# Patient Record
Sex: Female | Born: 2000
Health system: Southern US, Community
[De-identification: ages and names within clinical notes are randomized; demographics above are authoritative.]

## PROBLEM LIST (undated history)

## (undated) DIAGNOSIS — H539 Unspecified visual disturbance: Secondary | ICD-10-CM

## (undated) DIAGNOSIS — F39 Unspecified mood [affective] disorder: Secondary | ICD-10-CM

## (undated) DIAGNOSIS — F429 Obsessive-compulsive disorder, unspecified: Secondary | ICD-10-CM

## (undated) DIAGNOSIS — F419 Anxiety disorder, unspecified: Secondary | ICD-10-CM

## (undated) HISTORY — DX: Anxiety disorder, unspecified: F41.9

## (undated) HISTORY — DX: Unspecified mood (affective) disorder: F39

## (undated) HISTORY — DX: Unspecified visual disturbance: H53.9

## (undated) HISTORY — DX: Obsessive-compulsive disorder, unspecified: F42.9

---
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
## 2020-01-01 ENCOUNTER — Other Ambulatory Visit (HOSPITAL_COMMUNITY): Payer: 59 | Attending: Psychiatry | Admitting: Licensed Clinical Social Worker

## 2020-01-01 ENCOUNTER — Other Ambulatory Visit: Payer: Self-pay

## 2020-01-01 DIAGNOSIS — R45851 Suicidal ideations: Secondary | ICD-10-CM | POA: Insufficient documentation

## 2020-01-01 DIAGNOSIS — Z915 Personal history of self-harm: Secondary | ICD-10-CM | POA: Insufficient documentation

## 2020-01-01 DIAGNOSIS — F329 Major depressive disorder, single episode, unspecified: Secondary | ICD-10-CM | POA: Insufficient documentation

## 2020-01-01 DIAGNOSIS — F419 Anxiety disorder, unspecified: Secondary | ICD-10-CM | POA: Insufficient documentation

## 2020-01-01 DIAGNOSIS — R4589 Other symptoms and signs involving emotional state: Secondary | ICD-10-CM | POA: Diagnosis not present

## 2020-01-01 DIAGNOSIS — R441 Visual hallucinations: Secondary | ICD-10-CM | POA: Insufficient documentation

## 2020-01-01 DIAGNOSIS — R440 Auditory hallucinations: Secondary | ICD-10-CM | POA: Diagnosis not present

## 2020-01-01 DIAGNOSIS — Z79899 Other long term (current) drug therapy: Secondary | ICD-10-CM | POA: Insufficient documentation

## 2020-01-01 DIAGNOSIS — Z658 Other specified problems related to psychosocial circumstances: Secondary | ICD-10-CM | POA: Insufficient documentation

## 2020-01-01 DIAGNOSIS — F429 Obsessive-compulsive disorder, unspecified: Secondary | ICD-10-CM | POA: Diagnosis not present

## 2020-01-01 DIAGNOSIS — F333 Major depressive disorder, recurrent, severe with psychotic symptoms: Secondary | ICD-10-CM | POA: Insufficient documentation

## 2020-01-01 DIAGNOSIS — F411 Generalized anxiety disorder: Secondary | ICD-10-CM | POA: Insufficient documentation

## 2020-01-05 ENCOUNTER — Encounter (HOSPITAL_COMMUNITY): Payer: Self-pay | Admitting: Specialist

## 2020-01-05 ENCOUNTER — Other Ambulatory Visit (HOSPITAL_COMMUNITY): Payer: 59 | Admitting: Licensed Clinical Social Worker

## 2020-01-05 ENCOUNTER — Other Ambulatory Visit: Payer: Self-pay

## 2020-01-05 ENCOUNTER — Other Ambulatory Visit (HOSPITAL_COMMUNITY): Payer: 59 | Admitting: Specialist

## 2020-01-05 DIAGNOSIS — Z915 Personal history of self-harm: Secondary | ICD-10-CM | POA: Diagnosis not present

## 2020-01-05 DIAGNOSIS — F411 Generalized anxiety disorder: Secondary | ICD-10-CM

## 2020-01-05 DIAGNOSIS — F329 Major depressive disorder, single episode, unspecified: Secondary | ICD-10-CM | POA: Diagnosis not present

## 2020-01-05 DIAGNOSIS — Z658 Other specified problems related to psychosocial circumstances: Secondary | ICD-10-CM | POA: Diagnosis not present

## 2020-01-05 DIAGNOSIS — R4589 Other symptoms and signs involving emotional state: Secondary | ICD-10-CM | POA: Diagnosis not present

## 2020-01-05 DIAGNOSIS — R441 Visual hallucinations: Secondary | ICD-10-CM | POA: Diagnosis not present

## 2020-01-05 DIAGNOSIS — F419 Anxiety disorder, unspecified: Secondary | ICD-10-CM | POA: Diagnosis not present

## 2020-01-05 DIAGNOSIS — F429 Obsessive-compulsive disorder, unspecified: Secondary | ICD-10-CM | POA: Diagnosis not present

## 2020-01-05 DIAGNOSIS — R440 Auditory hallucinations: Secondary | ICD-10-CM | POA: Diagnosis not present

## 2020-01-05 DIAGNOSIS — F333 Major depressive disorder, recurrent, severe with psychotic symptoms: Secondary | ICD-10-CM

## 2020-01-05 DIAGNOSIS — R45851 Suicidal ideations: Secondary | ICD-10-CM | POA: Diagnosis not present

## 2020-01-05 NOTE — Psych (Signed)
Virtual Visit via Video Note  I connected with Susan [name] on 01/01/20 at 10:00 AM EST by a video enabled telemedicine application and verified that I am speaking with the correct person using two identifiers.   I discussed the limitations of evaluation and management by telemedicine and the availability of in person appointments. The patient expressed understanding and agreed to proceed.  Follow Up Instructions:    I discussed the assessment and treatment plan with the patient. The patient was provided an opportunity to ask questions and all were answered. The patient agreed with the plan and demonstrated an understanding of the instructions.   The patient was advised to call back or seek an in-person evaluation if the symptoms worsen or if the condition fails to improve as anticipated.  I provided 60 minutes of non-face-to-face time during this encounter.   Quinn Axe, [facility]    Comprehensive Clinical Assessment (CCA) Note  01/01/2020 Susan [name] [id number]  Visit Diagnosis:      ICD-10-CM   1. Severe recurrent major depressive disorder with psychotic features (HCC)  F33.3   2. Generalized anxiety disorder  F41.1       CCA Part One  Part One has been completed on paper by the patient.  (See scanned document in Chart Review)  CCA Part Two A  Intake/Chief Complaint:  CCA Intake With Chief Complaint CCA Part Two Date: 01/01/20 CCA Part Two Time: 1000 Chief Complaint/Presenting Problem: Pt reports to [facility] per [facility]; pt was inpt for 1 week due to SI. Pt reports 1 attempt in 8th grade by cutting wrists but stopped self, no medical attention needed. Pt denies other hospitalizations. Pt reports seeing Gillie Manners for a few years- discharged in 2019 because pt was reporting she was "fine" due to fears therapist would share information from appointment with mother due to being a minor. Pt does not have a current therapist. Currently sees Dr. Len Blalock for psychiatry. Pt reports severe decrease in ADLs, including not showering for a few weeks. Pt reports stressors include school, college applications, and thoughts about the future. Pt reports [facility] gave her diagnosis of Borderline Personality Disorder. Pt shares she has passive SI daily, without intent/plan. AVH: Pt shares she has been seeing and hearing her dog around the house since the dog's death in 06/15/19. Pt shares she sees someone's "energy behind them- like a fog" for a few weeks now. Pt reports self-harm by cutting in past; most recently a month ago; denies current thoughts. Pt shares she has burned self freshman year; picks at nails until they bleed; and will slap/punch face (daily occurrence). Family history: Paternal Grandfather: bipolar and OCD; Paternal Grandmother: anxiety and OCD; Maternal Grandmother: depression. Pt reports increased isolation due to depression and COVID. Pt denies current SI with plan/intent or HI. Patients Currently Reported Symptoms/Problems: SI without plan/intent; recent self-harm acts; increased depression and anxiety; severely decreased ADLs; lacks motivation; no energy; "feels heavy, my body and my brain," can't get out of bed; lacks concentration; reports short term memory issues; feelings of hopelessness/worthlessness; no appetite currently (pt reports 30lb weight gain since March 2020 but does not have appetite currently), sleep issues (getting better with increased Seroquel); hallucinations; increased irritability; impulsiveness Collateral Involvement: Patient's mother sat in on first part of CCA per patient request Individual's Strengths: motivation for treatment; understand treatment options; Individual's Preferences: to feel better Individual's Abilities: can attend and participate in group Type of Services Patient Feels Are Needed: PHP Initial  Clinical Notes/Concerns: Pt was discharged from inpt due to SI; pt was inpt for 1 week.  Mental  Health Symptoms Depression:  Depression: Change in energy/activity, Difficulty Concentrating, Fatigue, Hopelessness, Increase/decrease in appetite, Irritability, Weight gain/loss, Worthlessness, Tearfulness  Mania:     Anxiety:   Anxiety: Difficulty concentrating, Fatigue, Irritability, Restlessness, Sleep, Worrying, Tension  Psychosis:     Trauma:     Obsessions:     Compulsions:     Inattention:     Hyperactivity/Impulsivity:     Oppositional/Defiant Behaviors:     Borderline Personality:  Emotional Irregularity: Chronic feelings of emptiness, Mood lability, Potentially harmful impulsivity  Other Mood/Personality Symptoms:      Mental Status Exam Appearance and self-care  Stature:  Stature: Average  Weight:  Weight: Average weight  Clothing:  Clothing: Casual  Grooming:  Grooming: Normal  Cosmetic use:  Cosmetic Use: None  Posture/gait:  Posture/Gait: Normal  Motor activity:  Motor Activity: Not Remarkable  Sensorium  Attention:  Attention: Normal  Concentration:  Concentration: Normal  Orientation:     Recall/memory:  Recall/Memory: Normal  Affect and Mood  Affect:  Affect: Depressed, Tearful  Mood:  Mood: Depressed  Relating  Eye contact:  Eye Contact: Fleeting  Facial expression:  Facial Expression: Depressed  Attitude toward examiner:  Attitude Toward Examiner: Cooperative  Thought and Language  Speech flow: Speech Flow: Normal  Thought content:  Thought Content: Appropriate to mood and circumstances  Preoccupation:     Hallucinations:  Hallucinations: Auditory, Visual  Organization:     Company secretary of Knowledge:  Fund of Knowledge: Average  Intelligence:  Intelligence: Average  Abstraction:  Abstraction: Normal  Judgement:  Judgement: Poor  Reality Testing:  Reality Testing: Adequate  Insight:  Insight: Fair  Decision Making:  Decision Making: Impulsive  Social Functioning  Social Maturity:  Social Maturity: Impulsive, Isolates  Social  Judgement:  Social Judgement: Normal  Stress  Stressors:  Stressors: Family conflict, Transitions, Illness  Coping Ability:  Coping Ability: Deficient supports  Skill Deficits:     Supports:      Family and Psychosocial History: Family history Marital status: Single Are you sexually active?: No What is your sexual orientation?: queer Does patient have children?: No  Childhood History:  Childhood History By whom was/is the patient raised?: Both parents Additional childhood history information: Pt reports father works in [place] and returns a few days a month. Pt shares the house is "disrupted" when father returns because "he's a know it all and thinks we're not living right but it works for [place]." Description of patient's relationship with caregiver when they were a child: good with mom; distant with dad Patient's description of current relationship with people who raised him/her: same How were you disciplined when you got in trouble as a child/adolescent?: grounding Does patient have siblings?: Yes Number of Siblings: 1 Description of patient's current relationship with siblings: older brother; ok Did patient suffer any verbal/emotional/physical/sexual abuse as a child?: No Did patient suffer from severe childhood neglect?: No Has patient ever been sexually abused/assaulted/raped as an adolescent or adult?: No Was the patient ever a victim of a crime or a disaster?: No Witnessed domestic violence?: No Has patient been effected by domestic violence as an adult?: No  CCA Part Two B  Employment/Work Situation: Employment / Work Psychologist, occupational Employment situation: Surveyor, minerals job has been impacted by current illness: Yes Describe how patient's job has been impacted: pt reports decreased motivation to complete school assignments; inability to  focus Did You Receive Any Psychiatric Treatment/Services While in the Military?: No Are There Guns or Other Weapons in Your Home?: Yes Types of  Guns/Weapons: 3 side arms Are These Weapons Safely Secured?: Yes(mother verifies)  Education: Education Did Garment/textile technologist From [employer]?: (current high school student) Did You Have An Individualized Education Program (IIEP): No Did You Have Any Difficulty At [employer]?: Yes Were Any Medications Ever Prescribed For These Difficulties?: Yes Medications Prescribed For School Difficulties?: depression, anxiety  Religion: Religion/Spirituality Are You A Religious Person?: No  Leisure/Recreation: Leisure / Recreation Leisure and Hobbies: hanging out with friends  Exercise/Diet: Exercise/Diet Do You Exercise?: No Have You Gained or Lost A Significant Amount of Weight in the Past Six Months?: Yes-Gained Number of Pounds Gained: 30(1 year) Do You Follow a Special Diet?: No Do You Have Any Trouble Sleeping?: Yes Explanation of Sleeping Difficulties: Seroquel helps; trouble falling and staying asleep  CCA Part Two C  Alcohol/Drug Use: Alcohol / Drug Use Pain Medications: SEE MAR Prescriptions: SEE MAR Over the Counter: SEE MAR History of alcohol / drug use?: Yes Substance #1 Name of Substance 1: Marijuana 1 - Age of First Use: teens 1 - Amount (size/oz): varies; social user 1 - Frequency: 1x every other month 1 - Duration: on-going 1 - Last Use / Amount: 1 month ago Substance #2 Name of Substance 2: CBD 2 - Age of First Use: teens 2 - Amount (size/oz): varies 2 - Frequency: daily 2 - Duration: not current 2 - Last Use / Amount: 12/18/2019    CCA Part Three  ASAM's:  Six Dimensions of Multidimensional Assessment  Dimension 1:  Acute Intoxication and/or Withdrawal Potential:     Dimension 2:  Biomedical Conditions and Complications:     Dimension 3:  Emotional, Behavioral, or Cognitive Conditions and Complications:     Dimension 4:  Readiness to Change:     Dimension 5:  Relapse, Continued use, or Continued Problem Potential:     Dimension 6:  Recovery/Living  Environment:      Substance use Disorder (SUD)    Social Function:  Social Functioning Social Maturity: Impulsive, Isolates Social Judgement: Normal  Stress:  Stress Stressors: Family conflict, Transitions, Illness Coping Ability: Deficient supports Priority Risk: Moderate Risk  Risk Assessment- Self-Harm Potential: Risk Assessment For Self-Harm Potential Thoughts of Self-Harm: Vague current thoughts Method: No plan Availability of Means: No access/NA Additional Information for Self-Harm Potential: Acts of Self-harm, Previous Attempts Additional Comments for Self-Harm Potential: Pt reports 1 previous attempt in 8th grade by cutting- able to stop self; pt reports acts of self-harm (cutting, burning, picking nails, punching/slapping face): punching/slapping face and nail picking until they bleed daily; cutting- most recent 1 month ago  Risk Assessment -Dangerous to Others Potential: Risk Assessment For Dangerous to Others Potential Method: No Plan  DSM5 Diagnoses: Patient Active Problem List   Diagnosis Date Noted  . Severe recurrent major depressive disorder with psychotic features (HCC) 01/01/2020  . Generalized anxiety disorder 01/01/2020  . 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    Patient Centered Plan: Patient is on the following Treatment Plan(s):  Depression  Recommendations for Services/Supports/Treatments: Recommendations for Services/Supports/Treatments Recommendations For Services/Supports/Treatments: Partial Hospitalization(Pt just discharged from inpt due to SI; pt has severe decrease in ADLs; pt has passive SI and hallucinations)  Treatment Plan Summary:  Pt states, "I want to function."  Referrals to Alternative Service(s): Referred to Alternative Service(s):  Place:   Date:   Time:    Referred to Alternative Service(s):   Place:   Date:   Time:    Referred to  Alternative Service(s):   Place:   Date:   Time:    Referred to Alternative Service(s):   Place:   Date:   Time:     Royetta Crochet

## 2020-01-05 NOTE — Psych (Signed)
Virtual Visit via Video Note  I connected with Susan [name] on 01/05/20 at  9:00 AM EDT by a video enabled telemedicine application and verified that I am speaking with the correct person using two identifiers.   I discussed the limitations of evaluation and management by telemedicine and the availability of in person appointments. The patient expressed understanding and agreed to proceed.  Follow Up Instructions:    I discussed the assessment and treatment plan with the patient. The patient was provided an opportunity to ask questions and all were answered. The patient agreed with the plan and demonstrated an understanding of the instructions.   The patient was advised to call back or seek an in-person evaluation if the symptoms worsen or if the condition fails to improve as anticipated.  I provided 10 minutes of non-face-to-face time during this encounter.  Patient verbally agrees to treatment plan.  Quinn Axe, [facility]

## 2020-01-05 NOTE — Psych (Deleted)
Virtual Visit via Video Note  I connected with Susan [name] on 01/05/20 at  by a video enabled telemedicine application and verified that I am speaking with the correct person using two identifiers.   I discussed the limitations of evaluation and management by telemedicine and the availability of in person appointments. The patient expressed understanding and agreed to proceed.  Follow Up Instructions:    I discussed the assessment and treatment plan with the patient. The patient was provided an opportunity to ask questions and all were answered. The patient agreed with the plan and demonstrated an understanding of the instructions.   The patient was advised to call back or seek an in-person evaluation if the symptoms worsen or if the condition fails to improve as anticipated.  I provided *** minutes of non-face-to-face time during this encounter.   Quinn Axe, [facility]

## 2020-01-05 NOTE — Therapy (Signed)
[facility] PARTIAL HOSPITALIZATION PROGRAM [address] [place], [place], [address] Phone: [phone]   Fax:  [phone]  Occupational Therapy Evaluation  Patient Details  Name: Susan [name] MRN: [id number] Date of Birth: 05/18/2001 Referring Provider (OT): Hillery Jacks   Encounter Date: 01/05/2020  OT End of Session - 01/05/20 1320    Visit Number  1    Number of Visits  8    Date for OT Re-Evaluation  02/02/20    OT Start Time  1030    OT Stop Time  1100    OT Time Calculation (min)  30 min    Activity Tolerance  Patient tolerated treatment well    Behavior During Therapy  [facility] for tasks assessed/performed       Past Medical History:  Diagnosis Date  . Anxiety   . Mood disorder (HCC)   . OCD (obsessive compulsive disorder)   . Vision abnormalities     History reviewed. No pertinent surgical history.  There were no vitals filed for this visit.     [facility] OT Assessment - 01/05/20 0001      Assessment   Medical Diagnosis  Difficulty Coping, GAD, MDD    Referring Provider (OT)  Hillery Jacks      Precautions   Precautions  None      Restrictions   Weight Bearing Restrictions  No      Balance Screen   Has the patient fallen in the past 6 months  No    Has the patient had a decrease in activity level because of a fear of falling?   No    Is the patient reluctant to leave their home because of a fear of falling?   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 [employer] enjoys watching SNL, wants to be a Production manager support parent, small circle of friends  Struggles: healthy coping strategies OT goal:  Learn healthy coping  strategies  OCAIRS Mental Health Interview Summary of Client Scores:  FACILITATES PARTICIPATION IN OCCUPATION  ALLOWS PARTICIPATION IN OCCUPATION INHIBITS PARTICIPATION IN OCCUPATION RESTRICTS PARTICIPATION IN OCCUPATION COMMENTS  ROLES   x    HABITS   x    PERSONAL CAUSATION  x     VALUES   x    INTERESTS   x    SKILLS   x    SHORT TERM GOALS    x   LONG TERM GOALS   x    INTERPETATION OF PAST EXPERIENCES   x    PHYSICAL ENVIRONMENT  x     SOCIAL ENVIRONMENT   x    READINESS FOR CHANGE   x     Need for Occupational Therapy:  4 Shows positive occupational participation, no need for OT.   3 Need for minimal intervention/consultative participation  x 2 Need for OT intervention indicated to restore/improve participation   1 Need for extensive OT intervention indicated to improve participation.  Referral for follow up services also recommended.   Assessment:  Patient demonstrates behavior that inhibits participation in occupation.  Patient will benefit from occupational therapy intervention in order to restore and improve participation  in ADLs, improve time management, financial management, stress management, job readiness skills, social skills, and health management skills in preparation to return to full time community living and to be a productive community member.   Plan:  Patient will participate in skilled occupational therapy sessions individually or in a group setting to improve coping skills, psychosocial skills, and emotional skills required to return to prior level of function. Treatment will be 2 times per week for 4weeks.                  OT Education - 01/05/20 1319    Education Details  reviewed OT goals and POC    Person(s) Educated  Patient    Methods  Explanation    Comprehension  Verbalized understanding       OT Short Term Goals - 01/05/20 1322      OT SHORT TERM GOAL #1   Title  Patient will be educated on strategies to improve psychosocial skills  needed to participate fully in all daily, work, and leisure activities.    Time  4    Period  Weeks    Status  New    Target Date  02/02/20      OT SHORT TERM GOAL #2   Title  Pt will apply psychosocial skills and coping mechanisms to daily activities in order to function independently and reintegrate into community    Time  4    Period  Weeks    Status  New      OT SHORT TERM GOAL #3   Title  Pt will create and implement functional BADL/IADL routine upon reintegrating into the community for increased engagement in all daily, work, and leisure activities    Time  4    Period  Weeks    Status  New      OT SHORT TERM GOAL #4   Title  Pt will engage in goal setting to improve functional BADL/IADL routine upon reintegrating into community.    Time  4    Period  Weeks    Status  New      OT SHORT TERM GOAL #5   Title  Pt will recall and/or apply 1-3 sleep hygiene strategies to improve function in BADL routine upon reintegrating into community.    Time  4    Period  Weeks    Status  New               Plan - 01/05/20 1321    OT Occupational Profile and History  Problem Focused Assessment - Including review of records relating to presenting problem    Occupational performance deficits (Please refer to evaluation for details):  ADL's;IADL's;Rest and Sleep;Education;Work;Leisure;Social Participation    Body Structure / Function / Physical Skills  ADL    Cognitive Skills  Emotional;Energy/Drive;Temperament/Personality    Psychosocial Skills  Coping Strategies;Environmental  Adaptations;Habits;Interpersonal Interaction;Routines and Behaviors    Rehab Potential  Good    Comorbidities Affecting Occupational Performance:  None    Modification or Assistance to Complete Evaluation   No modification of tasks or assist necessary to complete eval    OT Frequency  Other (comment)   3 times a week for 1 week, and then 2 times a week for 3 weeks   OT Duration  Other (comment)    OT  Treatment/Interventions  Self-care/ADL training;Psychosocial skills training;Coping strategies training;Patient/family education    Consulted and Agree with Plan of Care  Patient  Patient will benefit from skilled therapeutic intervention in order to improve the following deficits and impairments:   Body Structure / Function / Physical Skills: ADL Cognitive Skills: Emotional, Energy/Drive, Temperament/Personality Psychosocial Skills: Coping Strategies, Environmental  Adaptations, Habits, Interpersonal Interaction, Routines and Behaviors   Visit Diagnosis: Difficulty coping  Severe recurrent major depressive disorder with psychotic features (HCC)  Generalized anxiety disorder    Problem List Patient Active Problem List   Diagnosis Date Noted  . Severe recurrent major depressive disorder with psychotic features (HCC) 01/01/2020  . Generalized anxiety disorder 01/01/2020  . 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

## 2020-01-06 ENCOUNTER — Other Ambulatory Visit (HOSPITAL_COMMUNITY): Payer: 59 | Admitting: Specialist

## 2020-01-06 ENCOUNTER — Other Ambulatory Visit: Payer: Self-pay

## 2020-01-06 ENCOUNTER — Other Ambulatory Visit (HOSPITAL_COMMUNITY): Payer: 59 | Admitting: Licensed Clinical Social Worker

## 2020-01-06 DIAGNOSIS — F333 Major depressive disorder, recurrent, severe with psychotic symptoms: Secondary | ICD-10-CM

## 2020-01-06 DIAGNOSIS — R4589 Other symptoms and signs involving emotional state: Secondary | ICD-10-CM

## 2020-01-06 DIAGNOSIS — F411 Generalized anxiety disorder: Secondary | ICD-10-CM

## 2020-01-06 NOTE — Therapy (Signed)
Patient not present in OT group this date, as her power went out and she was not able to connect. Shirlean Mylar, MHA, OTR/L [phone]

## 2020-01-07 ENCOUNTER — Other Ambulatory Visit (HOSPITAL_COMMUNITY): Payer: 59 | Admitting: Licensed Clinical Social Worker

## 2020-01-07 ENCOUNTER — Other Ambulatory Visit: Payer: Self-pay

## 2020-01-07 ENCOUNTER — Encounter (HOSPITAL_COMMUNITY): Payer: Self-pay

## 2020-01-07 DIAGNOSIS — F419 Anxiety disorder, unspecified: Secondary | ICD-10-CM | POA: Diagnosis not present

## 2020-01-07 DIAGNOSIS — R440 Auditory hallucinations: Secondary | ICD-10-CM | POA: Diagnosis not present

## 2020-01-07 DIAGNOSIS — F329 Major depressive disorder, single episode, unspecified: Secondary | ICD-10-CM | POA: Diagnosis not present

## 2020-01-07 DIAGNOSIS — R441 Visual hallucinations: Secondary | ICD-10-CM | POA: Diagnosis not present

## 2020-01-07 DIAGNOSIS — Z915 Personal history of self-harm: Secondary | ICD-10-CM | POA: Diagnosis not present

## 2020-01-07 DIAGNOSIS — R45851 Suicidal ideations: Secondary | ICD-10-CM | POA: Diagnosis not present

## 2020-01-07 DIAGNOSIS — F411 Generalized anxiety disorder: Secondary | ICD-10-CM

## 2020-01-07 DIAGNOSIS — F333 Major depressive disorder, recurrent, severe with psychotic symptoms: Secondary | ICD-10-CM

## 2020-01-07 DIAGNOSIS — Z658 Other specified problems related to psychosocial circumstances: Secondary | ICD-10-CM | POA: Diagnosis not present

## 2020-01-07 DIAGNOSIS — R4589 Other symptoms and signs involving emotional state: Secondary | ICD-10-CM | POA: Diagnosis not present

## 2020-01-07 DIAGNOSIS — F429 Obsessive-compulsive disorder, unspecified: Secondary | ICD-10-CM | POA: Diagnosis not present

## 2020-01-07 NOTE — Progress Notes (Signed)
Spoke with patient via Webex video call, used 2 identifiers to correctly identify patient. This is her first time in PHP. She was inpatient in [place] for[place]D, depression, and anxiety a couple weeks ago. She started [facility] Monday but her power went out yesterday and she missed group. She is enjoying groups so far and likes that they are small. She feels comfortable sharing in small groups. School is her biggest stressor, she is a Holiday representative in high school. During her inpatient stay she stopped Prozac and started Effexor and she has felt much better. No side effects noted. No issues or complaints. On scale 1-10 as 10 being worst she rates depression at 3 and anxiety at 3. Denies SI/HI or AV hallucinations. PHQ9=10.

## 2020-01-08 ENCOUNTER — Other Ambulatory Visit (HOSPITAL_COMMUNITY): Payer: 59

## 2020-01-08 ENCOUNTER — Other Ambulatory Visit: Payer: Self-pay

## 2020-01-08 ENCOUNTER — Telehealth (HOSPITAL_COMMUNITY): Payer: Self-pay | Admitting: Professional

## 2020-01-09 ENCOUNTER — Other Ambulatory Visit (HOSPITAL_COMMUNITY): Payer: 59 | Admitting: Family

## 2020-01-09 ENCOUNTER — Other Ambulatory Visit (HOSPITAL_COMMUNITY): Payer: 59

## 2020-01-09 ENCOUNTER — Other Ambulatory Visit: Payer: Self-pay

## 2020-01-09 ENCOUNTER — Encounter (HOSPITAL_COMMUNITY): Payer: Self-pay | Admitting: Family

## 2020-01-09 DIAGNOSIS — R4589 Other symptoms and signs involving emotional state: Secondary | ICD-10-CM

## 2020-01-09 NOTE — Progress Notes (Signed)
Multiple attempts made to follow-up with admission assessment note.  No answer. Staff reported patient is excused from group today.  Will attempt to follow-up on Monday 3/22.

## 2020-01-12 ENCOUNTER — Other Ambulatory Visit: Payer: Self-pay

## 2020-01-12 ENCOUNTER — Other Ambulatory Visit (HOSPITAL_COMMUNITY): Payer: 59 | Admitting: Licensed Clinical Social Worker

## 2020-01-12 DIAGNOSIS — F333 Major depressive disorder, recurrent, severe with psychotic symptoms: Secondary | ICD-10-CM

## 2020-01-12 DIAGNOSIS — Z915 Personal history of self-harm: Secondary | ICD-10-CM | POA: Diagnosis not present

## 2020-01-12 DIAGNOSIS — R441 Visual hallucinations: Secondary | ICD-10-CM | POA: Diagnosis not present

## 2020-01-12 DIAGNOSIS — F329 Major depressive disorder, single episode, unspecified: Secondary | ICD-10-CM | POA: Diagnosis not present

## 2020-01-12 DIAGNOSIS — R45851 Suicidal ideations: Secondary | ICD-10-CM | POA: Diagnosis not present

## 2020-01-12 DIAGNOSIS — R4589 Other symptoms and signs involving emotional state: Secondary | ICD-10-CM | POA: Diagnosis not present

## 2020-01-12 DIAGNOSIS — R440 Auditory hallucinations: Secondary | ICD-10-CM | POA: Diagnosis not present

## 2020-01-12 DIAGNOSIS — F411 Generalized anxiety disorder: Secondary | ICD-10-CM

## 2020-01-12 DIAGNOSIS — F429 Obsessive-compulsive disorder, unspecified: Secondary | ICD-10-CM | POA: Diagnosis not present

## 2020-01-12 DIAGNOSIS — F419 Anxiety disorder, unspecified: Secondary | ICD-10-CM | POA: Diagnosis not present

## 2020-01-12 DIAGNOSIS — Z658 Other specified problems related to psychosocial circumstances: Secondary | ICD-10-CM | POA: Diagnosis not present

## 2020-01-12 NOTE — Progress Notes (Signed)
Spoke with patient via Webex Video call, used 2 identifiers to correctly identify patient. She states that groups are going well and she has taken away a few things that have been discussed. Denies SI/HI or AV hallucinations. On scale 1-10 as 10 being worst she rates depression at 2 and anxiety at 2. No other issues or complaints.

## 2020-01-13 ENCOUNTER — Other Ambulatory Visit: Payer: Self-pay

## 2020-01-13 ENCOUNTER — Encounter (HOSPITAL_COMMUNITY): Payer: Self-pay | Admitting: Family

## 2020-01-13 ENCOUNTER — Other Ambulatory Visit (HOSPITAL_COMMUNITY): Payer: 59 | Admitting: Licensed Clinical Social Worker

## 2020-01-13 ENCOUNTER — Other Ambulatory Visit (HOSPITAL_COMMUNITY): Payer: 59 | Admitting: Family

## 2020-01-13 DIAGNOSIS — F329 Major depressive disorder, single episode, unspecified: Secondary | ICD-10-CM | POA: Diagnosis not present

## 2020-01-13 DIAGNOSIS — Z658 Other specified problems related to psychosocial circumstances: Secondary | ICD-10-CM | POA: Diagnosis not present

## 2020-01-13 DIAGNOSIS — F333 Major depressive disorder, recurrent, severe with psychotic symptoms: Secondary | ICD-10-CM

## 2020-01-13 DIAGNOSIS — Z915 Personal history of self-harm: Secondary | ICD-10-CM | POA: Diagnosis not present

## 2020-01-13 DIAGNOSIS — R45851 Suicidal ideations: Secondary | ICD-10-CM | POA: Diagnosis not present

## 2020-01-13 DIAGNOSIS — F419 Anxiety disorder, unspecified: Secondary | ICD-10-CM | POA: Diagnosis not present

## 2020-01-13 DIAGNOSIS — F411 Generalized anxiety disorder: Secondary | ICD-10-CM

## 2020-01-13 DIAGNOSIS — R4589 Other symptoms and signs involving emotional state: Secondary | ICD-10-CM | POA: Diagnosis not present

## 2020-01-13 DIAGNOSIS — F429 Obsessive-compulsive disorder, unspecified: Secondary | ICD-10-CM | POA: Diagnosis not present

## 2020-01-13 DIAGNOSIS — R440 Auditory hallucinations: Secondary | ICD-10-CM | POA: Diagnosis not present

## 2020-01-13 DIAGNOSIS — R441 Visual hallucinations: Secondary | ICD-10-CM | POA: Diagnosis not present

## 2020-01-13 NOTE — Therapy (Signed)
Virtual Visit via Video Note  I connected with Susan [name] on 01/13/20 at  8:00 AM EDT by a video enabled telemedicine application and verified that I am speaking with the correct person using two identifiers.   I discussed the limitations of evaluation and management by telemedicine and the availability of in person appointments. The patient expressed understanding and agreed to proceed.    I discussed the assessment and treatment plan with the patient. The patient was provided an opportunity to ask questions and all were answered. The patient agreed with the plan and demonstrated an understanding of the instructions.   The patient was advised to call back or seek an in-person evaluation if the symptoms worsen or if the condition fails to improve as anticipated.  I provided 75 minutes of non-face-to-face time during this encounter.     [facility] PARTIAL HOSPITALIZATION PROGRAM [address] [place], [place], [address] Phone: [phone]   Fax:  [phone]  Occupational Therapy Treatment  Patient Details  Name: Susan [name] MRN: [id number] Date of Birth: 02-11-2001 Referring Provider (OT): Hillery Jacks   Encounter Date: 01/13/2020  OT End of Session - 01/13/20 2158    Visit Number  2    Number of Visits  8    Date for OT Re-Evaluation  02/02/20    OT Start Time  1100    OT Stop Time  1215    OT Time Calculation (min)  75 min    Activity Tolerance  Patient tolerated treatment well    Behavior During Therapy  [facility] for tasks assessed/performed       Past Medical History:  Diagnosis Date  . Anxiety   . Mood disorder (HCC)   . OCD (obsessive compulsive disorder)   . Vision abnormalities     History reviewed. No pertinent surgical history.  There were no vitals filed for this visit.  Subjective Assessment - 01/13/20 2157    Multiple Pain Sites  No       S:  I am having trouble making an assertive statement because  there are so many things this patient has done. O:  Group began with reflection on circle of control from previous session.  Group members shared their reflections and progress with use of this coping tool.  Group focus today was on communication tool of assertiveness.  Group completed an assertive quiz, which identified if they tended to be assertive all of the time, often, occasionally, or never.  Group members reflected on whether their score correlated with their beliefs in their actions.  Group explored connotation of assertiveness and determined it was almost always a positive trait.  Group was educated on benefits of being assertive, including:  self esteem, goal achievement, anxiety reduction, development of mutual respect for others, improved decision making and empowerment, and improved self expression.  Group reflected on roadblocks they experience that prevents them from being assertive, including lack of confidence, fearful of coming across as aggressive, and being too emotional to do so.  Patient was educated on definition of passive, assertive, and aggressive communication.  Benefits and drawbacks of each communication style, nonverbal communication/body language associated with each communication style.  Tips for communicating assertively were shared with group, including:  use of I statements, clearly stating your needs, non verbal skills, self respect, speaking calmly, planning and rehearsing conversation/email/text response, scheduling time to respond to an unplanned conversation that causes emotional response/trigger, and use of the x/y/z formula.  The x  y z formula uses the following formula:   I feel X when you do Y in situation Z and I would like _________.  (X = emotion, Y = specific behavior , Z = specific situation).  Each member then formulated a xyz statement for a particular situation they are currently encountering and shared with the group.  Group and OT provided feedback on  components being present or not in statement.  Group also discussed and were educated on strategies to assertively sayi no; including no as a sentence, assertively refusing requests, assertive ways of saying no, and steps in learning to say no.    A:  Susan [name] was mostly an observer throughout group.  She did particpate in xyz statement formulation and asked therapist how to condense - therapist educated patient to reflect on what the commonality was with the issues (ie, are most from person being lazy, or being late, or not communicating, etc)  Susan [name] stated that she understood how this would help clearly state the issue.  Susan [name] was able to formulate an I statement to share with her friend whom has been ignoring her since she was dc from hospital. P:  Continue skilled ot intervention 2 times per week for community reintegration.  Next session to focus on sleep hygiene. Susan [name], MHA, OTR/L [phone]                     OT Education - 01/13/20 2158    Education Details  assertive communication skills    Person(s) Educated  Patient    Methods  Explanation    Comprehension  Verbalized understanding       OT Short Term Goals - 01/13/20 2159      OT SHORT TERM GOAL #1   Title  Patient will be educated on strategies to improve psychosocial skills needed to participate fully in all daily, work, and leisure activities.    Time  4    Period  Weeks    Status  On-going    Target Date  02/02/20      OT SHORT TERM GOAL #2   Title  Pt will apply psychosocial skills and coping mechanisms to daily activities in order to function independently and reintegrate into community    Time  4    Period  Weeks    Status  On-going      OT SHORT TERM GOAL #3   Title  Pt will create and implement functional BADL/IADL routine upon reintegrating into the community for increased engagement in all daily, work, and leisure activities    Time  4    Period  Weeks    Status  On-going       OT SHORT TERM GOAL #4   Title  Pt will engage in goal setting to improve functional BADL/IADL routine upon reintegrating into community.    Time  4    Period  Weeks    Status  On-going      OT SHORT TERM GOAL #5   Title  Pt will recall and/or apply 1-3 sleep hygiene strategies to improve function in BADL routine upon reintegrating into community.    Time  4    Period  Weeks    Status  On-going               Plan - 01/13/20 2159    Occupational performance deficits (Please refer to evaluation for details):  ADL's;IADL's;Rest and Sleep;Education;Work;Leisure;Social Participation    Body Structure /  Function / Physical Skills  ADL    Cognitive Skills  Emotional;Energy/Drive;Temperament/Personality    Psychosocial Skills  Coping Strategies;Environmental  Adaptations;Habits;Interpersonal Interaction;Routines and Behaviors       Patient will benefit from skilled therapeutic intervention in order to improve the following deficits and impairments:   Body Structure / Function / Physical Skills: ADL Cognitive Skills: Emotional, Energy/Drive, Temperament/Personality Psychosocial Skills: Coping Strategies, Environmental  Adaptations, Habits, Interpersonal Interaction, Routines and Behaviors   Visit Diagnosis: Difficulty coping  Severe recurrent major depressive disorder with psychotic features (HCC)  Generalized anxiety disorder    Problem List Patient Active Problem List   Diagnosis Date Noted  . Severe recurrent major depressive disorder with psychotic features ([place]) 01/01/2020  . Generalized anxiety disorder 01/01/2020  . 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

## 2020-01-13 NOTE — Progress Notes (Signed)
Virtual Visit via Video Note  I connected with Susan [name] on 01/13/20 at  9:00 AM EDT by a video enabled telemedicine application and verified that I am speaking with the correct person using two identifiers.   I discussed the limitations of evaluation and management by telemedicine and the availability of in person appointments. The patient expressed understanding and agreed to proceed.  I discussed the assessment and treatment plan with the patient. The patient was provided an opportunity to ask questions and all were answered. The patient agreed with the plan and demonstrated an understanding of the instructions.   The patient was advised to call back or seek an in-person evaluation if the symptoms worsen or if the condition fails to improve as anticipated.  I provided 30 minutes of non-face-to-face time during this encounter.   Oneta Rack, NP     Behavioral Health Partial Program Assessment Note  Date: 01/13/2020 Name: Susan [name] MRN: [id number]    HPI: Susan [name] is a 19 y.o. Caucasian female presents with depression and anxiety.Reportes she was recently discharge from [facility] for suicidal ideations. Reported she was experiencing auditory and visual hallicaitons on admission. Today she is denying. Reported feeling better after her discharge. Reported that she as always struggled with anxiety however has never had treatment.  Patient was enrolled in partial psychiatric program on 01/13/20.  Primary complaints include: anxiety and depression worse.  Onset of symptoms was gradual with gradually worsening course since that time. Psychosocial Stressors include the following: family.   Per admission assessment note on admission: Patient states that she has been isolating herself from others, has lost interest in her usual activities.  She denies specific suicidal thoughts, but states she would not care if something happened to her.  She denies any  specific intent or plan but is unable to contract for safety.  History of previous suicide attempts with cutting.  Patient with significant history of self-mutilation and cutting.  She verbalized that she last cut a little over a week ago over her shoulders and upper arms but they have well-healed.  She also reports that she frequently has visual and auditory hallucinations of her dog that died last 06/29/23.  She denies command hallucinations.  No history of inpatient treatment.  Denies any focal medical complaints today.  She is noted to have Band-Aids over many of her fingertips, she states that she picks at the skin on her fingers until they bleed, she puts Band-Aids over them to keep herself from doing so so they will get worse.  She denies any pain over these fingers.  No recent fevers or illness, no cough, chest pain or shortness of breath.  No abdominal pain, vomiting, headaches.   I have reviewed the following documentation dated past psychiatric history and past medical history  Complaints of Pain: nonear Past Psychiatric History:  Past psychiatric hospitalizations [address] , Previous suicide attempts and Past medication trials   Currently in treatment with was recently discontinue from Prozac and was started on Effexor.   Substance Abuse History: none Use of Alcohol: denied Use of Caffeine: denies use Use of over the counter:   No past surgical history on file.  Past Medical History:  Diagnosis Date  . Anxiety   . Mood disorder (HCC)   . OCD (obsessive compulsive disorder)   . Vision abnormalities    Outpatient Encounter Medications as of 01/13/2020  Medication Sig Note  . ALPRAZolam (XANAX) 0.25 MG tablet Take 0.25 mg  by mouth 3 (three) times daily as needed for anxiety (panic).    Marland Kitchen FLUoxetine (PROZAC) 10 MG capsule Take 10-20 mg by mouth See admin instructions. 10mg  in the morning and 20mg  at night   . lamoTRIgine (LAMICTAL) 100 MG tablet Take 1 tablet in the morning  and 2 tablets at nighttime (Patient taking differently: Take 100-200 mg by mouth See admin instructions. Take 100mg  in the morning and 200mg  at night)   . Norgestimate-Ethinyl Estradiol Triphasic (TRI-PREVIFEM) 0.18/0.215/0.25 MG-35 MCG tablet Take 1 tablet daily by mouth.   . QUEtiapine (SEROQUEL) 25 MG tablet Take 25 mg by mouth at bedtime. 01/07/2020: Takes 50mg   . venlafaxine XR (EFFEXOR-XR) 75 MG 24 hr capsule Take 75 mg by mouth daily.    No facility-administered encounter medications on file as of 01/13/2020.   Allergies  Allergen Reactions  . Kiwi Extract Other (See Comments)    Throat burns and swells a bit  . Latex     Mother said that her child had a reaction to latex, and only uses latex free band-aids  . Penicillins     Mother said child had a reaction to penicillin as infant. Has not been tested for allergy yet.    Social History   Tobacco Use  . Smoking status: Never Smoker  . Smokeless tobacco: Never Used  Substance Use Topics  . Alcohol use: No   Functioning Relationships: good support system and poor relationship with parents Education: High School: currently  Other Pertinent History: None Family History  Problem Relation Age of Onset  . Hypertension Maternal Uncle   . Arthritis Maternal Grandmother   . Heart disease Maternal Grandmother   . Heart disease Maternal Grandfather   . Stroke Maternal Grandfather   . Depression Paternal Grandfather   . Bipolar disorder Paternal Grandfather   . Asthma Neg Hx   . Cancer Neg Hx   . Diabetes Neg Hx   . Early death Neg Hx   . Hyperlipidemia Neg Hx   . Kidney disease Neg Hx      Review of Systems Constitutional: negative  Objective:  There were no vitals filed for this visit.  Physical Exam:   Mental Status Exam: Appearance:  Well groomed Psychomotor::  Within Normal Limits Attention span and concentration: Normal Behavior: calm, cooperative and adequate rapport can be established Speech:  normal pitch  and normal volume Mood:  depressed and anxious Affect:  normal Thought Process:  Coherent Thought Content:  Logical Orientation:  person, place and time/date Cognition:  grossly intact Insight:  Intact Judgment:  Intact Estimate of Intelligence: Average Fund of knowledge: Aware of current events Memory: Recent and remote intact Abnormal movements: None Gait and station:   Assessment:  Diagnosis: No primary diagnosis found. No diagnosis found.  Indications for admission: inpatient care required if not in partial hospital program  Plan: patient enrolled in Partial Hospitalization Program, patient's current medications are to be continued, a comprehensive treatment plan will be developed and side effects of medications have been reviewed with patient - Continue Effexor 75 mg and Seroquel 25 mg daily   Treatment options and alternatives reviewed with patient and patient understands the above plan. Treatment plan was reviewed and     Derrill Center, NP
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
## 2020-01-14 ENCOUNTER — Telehealth (HOSPITAL_COMMUNITY): Payer: Self-pay | Admitting: Professional

## 2020-01-14 ENCOUNTER — Other Ambulatory Visit: Payer: Self-pay

## 2020-01-14 ENCOUNTER — Other Ambulatory Visit (HOSPITAL_COMMUNITY): Payer: 59 | Admitting: Licensed Clinical Social Worker

## 2020-01-14 ENCOUNTER — Encounter (HOSPITAL_COMMUNITY): Payer: Self-pay | Admitting: Family

## 2020-01-14 DIAGNOSIS — R440 Auditory hallucinations: Secondary | ICD-10-CM | POA: Diagnosis not present

## 2020-01-14 DIAGNOSIS — F329 Major depressive disorder, single episode, unspecified: Secondary | ICD-10-CM | POA: Diagnosis not present

## 2020-01-14 DIAGNOSIS — R4589 Other symptoms and signs involving emotional state: Secondary | ICD-10-CM | POA: Diagnosis not present

## 2020-01-14 DIAGNOSIS — F419 Anxiety disorder, unspecified: Secondary | ICD-10-CM | POA: Diagnosis not present

## 2020-01-14 DIAGNOSIS — Z658 Other specified problems related to psychosocial circumstances: Secondary | ICD-10-CM | POA: Diagnosis not present

## 2020-01-14 DIAGNOSIS — Z915 Personal history of self-harm: Secondary | ICD-10-CM | POA: Diagnosis not present

## 2020-01-14 DIAGNOSIS — F429 Obsessive-compulsive disorder, unspecified: Secondary | ICD-10-CM | POA: Diagnosis not present

## 2020-01-14 DIAGNOSIS — R45851 Suicidal ideations: Secondary | ICD-10-CM | POA: Diagnosis not present

## 2020-01-14 DIAGNOSIS — F411 Generalized anxiety disorder: Secondary | ICD-10-CM

## 2020-01-14 DIAGNOSIS — R441 Visual hallucinations: Secondary | ICD-10-CM | POA: Diagnosis not present

## 2020-01-14 DIAGNOSIS — F333 Major depressive disorder, recurrent, severe with psychotic symptoms: Secondary | ICD-10-CM

## 2020-01-15 ENCOUNTER — Other Ambulatory Visit (HOSPITAL_COMMUNITY): Payer: 59 | Admitting: Licensed Clinical Social Worker

## 2020-01-15 ENCOUNTER — Other Ambulatory Visit: Payer: Self-pay

## 2020-01-15 ENCOUNTER — Other Ambulatory Visit (HOSPITAL_COMMUNITY): Payer: 59

## 2020-01-15 DIAGNOSIS — F329 Major depressive disorder, single episode, unspecified: Secondary | ICD-10-CM | POA: Diagnosis not present

## 2020-01-15 DIAGNOSIS — F411 Generalized anxiety disorder: Secondary | ICD-10-CM

## 2020-01-15 DIAGNOSIS — R4589 Other symptoms and signs involving emotional state: Secondary | ICD-10-CM | POA: Diagnosis not present

## 2020-01-15 DIAGNOSIS — R441 Visual hallucinations: Secondary | ICD-10-CM | POA: Diagnosis not present

## 2020-01-15 DIAGNOSIS — Z658 Other specified problems related to psychosocial circumstances: Secondary | ICD-10-CM | POA: Diagnosis not present

## 2020-01-15 DIAGNOSIS — R440 Auditory hallucinations: Secondary | ICD-10-CM | POA: Diagnosis not present

## 2020-01-15 DIAGNOSIS — Z915 Personal history of self-harm: Secondary | ICD-10-CM | POA: Diagnosis not present

## 2020-01-15 DIAGNOSIS — F429 Obsessive-compulsive disorder, unspecified: Secondary | ICD-10-CM | POA: Diagnosis not present

## 2020-01-15 DIAGNOSIS — F333 Major depressive disorder, recurrent, severe with psychotic symptoms: Secondary | ICD-10-CM

## 2020-01-15 DIAGNOSIS — R45851 Suicidal ideations: Secondary | ICD-10-CM | POA: Diagnosis not present

## 2020-01-15 DIAGNOSIS — F419 Anxiety disorder, unspecified: Secondary | ICD-10-CM | POA: Diagnosis not present

## 2020-01-15 NOTE — Progress Notes (Signed)
GROUP NOTE - 01/14/2020 Spiritual care group  11:00 - 12:00 ? Group met via web-ex due to COVID-19 precautions.  Group facilitated by Simone Curia, MDiv, BCC  ? ? Group focused on topic of strength. ?Group members reflected on what thoughts and feelings emerge when they hear this topic. ?They then engaged in facilitated dialog around how strength is present in their lives. This dialog focused on representing what strength had been to them in their lives (images and patterns given) and what they saw as helpful in their life now (what they needed / wanted). ? ? Activity drew on narrative framework   Braiden was present throughout group, though occasionally with camera off.  Did not have opportunity to participate in group discussion.

## 2020-01-16 ENCOUNTER — Encounter (HOSPITAL_COMMUNITY): Payer: Self-pay

## 2020-01-16 ENCOUNTER — Other Ambulatory Visit (HOSPITAL_COMMUNITY): Payer: 59 | Admitting: Specialist

## 2020-01-16 ENCOUNTER — Other Ambulatory Visit: Payer: Self-pay

## 2020-01-16 ENCOUNTER — Other Ambulatory Visit (HOSPITAL_COMMUNITY): Payer: 59 | Admitting: Licensed Clinical Social Worker

## 2020-01-16 DIAGNOSIS — R45851 Suicidal ideations: Secondary | ICD-10-CM | POA: Diagnosis not present

## 2020-01-16 DIAGNOSIS — Z658 Other specified problems related to psychosocial circumstances: Secondary | ICD-10-CM | POA: Diagnosis not present

## 2020-01-16 DIAGNOSIS — F429 Obsessive-compulsive disorder, unspecified: Secondary | ICD-10-CM | POA: Diagnosis not present

## 2020-01-16 DIAGNOSIS — F419 Anxiety disorder, unspecified: Secondary | ICD-10-CM | POA: Diagnosis not present

## 2020-01-16 DIAGNOSIS — F333 Major depressive disorder, recurrent, severe with psychotic symptoms: Secondary | ICD-10-CM

## 2020-01-16 DIAGNOSIS — F411 Generalized anxiety disorder: Secondary | ICD-10-CM

## 2020-01-16 DIAGNOSIS — F329 Major depressive disorder, single episode, unspecified: Secondary | ICD-10-CM | POA: Diagnosis not present

## 2020-01-16 DIAGNOSIS — R440 Auditory hallucinations: Secondary | ICD-10-CM | POA: Diagnosis not present

## 2020-01-16 DIAGNOSIS — R4589 Other symptoms and signs involving emotional state: Secondary | ICD-10-CM | POA: Diagnosis not present

## 2020-01-16 DIAGNOSIS — F332 Major depressive disorder, recurrent severe without psychotic features: Secondary | ICD-10-CM | POA: Insufficient documentation

## 2020-01-16 DIAGNOSIS — R441 Visual hallucinations: Secondary | ICD-10-CM | POA: Diagnosis not present

## 2020-01-16 DIAGNOSIS — Z915 Personal history of self-harm: Secondary | ICD-10-CM | POA: Diagnosis not present

## 2020-01-16 NOTE — Therapy (Signed)
Virtual Visit via Video Note  I connected with Susan [name] on 01/16/20 at  11:00 AM EDT by a video enabled telemedicine application and verified that I am speaking with the correct person using two identifiers.   I discussed the limitations of evaluation and management by telemedicine and the availability of in person appointments. The patient expressed understanding and agreed to proceed.  I discussed the assessment and treatment plan with the patient. The patient was provided an opportunity to ask questions and all were answered. The patient agreed with the plan and demonstrated an understanding of the instructions.   The patient was advised to call back or seek an in-person evaluation if the symptoms worsen or if the condition fails to improve as anticipated.  I provided 50 minutes of non-face-to-face time during this encounter.     [facility] PARTIAL HOSPITALIZATION PROGRAM [address] [place], [place], [address] Phone: [phone]   Fax:  [phone]  Occupational Therapy Treatment  Patient Details  Name: Susan [name] MRN: [id number] Date of Birth: Mar 09, 2001 Referring Provider (OT): Hillery Jacks   Encounter Date: 01/16/2020  OT End of Session - 01/16/20 1437    Visit Number  3    Number of Visits  8    Date for OT Re-Evaluation  02/02/20    OT Start Time  1105    OT Stop Time  1155    OT Time Calculation (min)  50 min    Activity Tolerance  Patient tolerated treatment well    Behavior During Therapy  [facility] for tasks assessed/performed       Past Medical History:  Diagnosis Date  . Anxiety   . Mood disorder (HCC)   . OCD (obsessive compulsive disorder)   . Vision abnormalities     History reviewed. No pertinent surgical history.  There were no vitals filed for this visit.  Subjective Assessment - 01/16/20 1437    Currently in Pain?  No/denies          S: I seep in my work clothes  O: patient participated  in skilled OT group focusing on improving sleep hygiene.  Patient was educated and discussed benefits of getting enough sleep, detriments of getting too much or too little sleep.  Patient and group discussed strategies for improving sleep including routines, environmental factors, diet, exercises, calming activities.  Patient was educated on use of sleep diary to identify current sleep patterns and strategies to improve sleep routine.   A:   [name] was an observer today, not turning camera on and only answering one question when prompted by therapist.  [name] did commit to changing her clothes before going to sleep (currently sleeps in work clothes).  P:  continue skilled OT group 2 times per week for 3 weeks.  Next session to focus on financial management. Shirlean Mylar, MHA, OTR/L [phone]                   OT Education - 01/16/20 1437    Education Details  sleep hygiene    Person(s) Educated  Patient    Methods  Explanation    Comprehension  Verbalized understanding       OT Short Term Goals - 01/13/20 2159      OT SHORT TERM GOAL #1   Title  Patient will be educated on strategies to improve psychosocial skills needed to participate fully in all daily, work, and leisure activities.    Time  4  Period  Weeks    Status  On-going    Target Date  02/02/20      OT SHORT TERM GOAL #2   Title  Pt will apply psychosocial skills and coping mechanisms to daily activities in order to function independently and reintegrate into community    Time  4    Period  Weeks    Status  On-going      OT SHORT TERM GOAL #3   Title  Pt will create and implement functional BADL/IADL routine upon reintegrating into the community for increased engagement in all daily, work, and leisure activities    Time  4    Period  Weeks    Status  On-going      OT SHORT TERM GOAL #4   Title  Pt will engage in goal setting to improve functional BADL/IADL routine upon reintegrating into  community.    Time  4    Period  Weeks    Status  On-going      OT SHORT TERM GOAL #5   Title  Pt will recall and/or apply 1-3 sleep hygiene strategies to improve function in BADL routine upon reintegrating into community.    Time  4    Period  Weeks    Status  On-going               Plan - 01/16/20 1437    Body Structure / Function / Physical Skills  ADL    Cognitive Skills  Emotional;Energy/Drive;Temperament/Personality    Psychosocial Skills  Coping Strategies;Environmental  Adaptations;Habits;Interpersonal Interaction;Routines and Behaviors       Patient will benefit from skilled therapeutic intervention in order to improve the following deficits and impairments:   Body Structure / Function / Physical Skills: ADL Cognitive Skills: Emotional, Energy/Drive, Temperament/Personality Psychosocial Skills: Coping Strategies, Environmental  Adaptations, Habits, Interpersonal Interaction, Routines and Behaviors   Visit Diagnosis: Severe recurrent major depressive disorder with psychotic features (HCC)  Difficulty coping  Generalized anxiety disorder    Problem List Patient Active Problem List   Diagnosis Date Noted  . Severe recurrent major depressive disorder with psychotic features ([place]) 01/01/2020  . Generalized anxiety disorder 01/01/2020  . 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

## 2020-01-19 ENCOUNTER — Other Ambulatory Visit: Payer: Self-pay

## 2020-01-19 ENCOUNTER — Other Ambulatory Visit (HOSPITAL_COMMUNITY): Payer: 59 | Admitting: Specialist

## 2020-01-19 ENCOUNTER — Encounter (HOSPITAL_COMMUNITY): Payer: Self-pay

## 2020-01-19 ENCOUNTER — Other Ambulatory Visit (HOSPITAL_COMMUNITY): Payer: 59 | Admitting: Licensed Clinical Social Worker

## 2020-01-19 DIAGNOSIS — F411 Generalized anxiety disorder: Secondary | ICD-10-CM

## 2020-01-19 DIAGNOSIS — F419 Anxiety disorder, unspecified: Secondary | ICD-10-CM | POA: Diagnosis not present

## 2020-01-19 DIAGNOSIS — F333 Major depressive disorder, recurrent, severe with psychotic symptoms: Secondary | ICD-10-CM

## 2020-01-19 DIAGNOSIS — R440 Auditory hallucinations: Secondary | ICD-10-CM | POA: Diagnosis not present

## 2020-01-19 DIAGNOSIS — R4589 Other symptoms and signs involving emotional state: Secondary | ICD-10-CM

## 2020-01-19 DIAGNOSIS — F429 Obsessive-compulsive disorder, unspecified: Secondary | ICD-10-CM | POA: Diagnosis not present

## 2020-01-19 DIAGNOSIS — R45851 Suicidal ideations: Secondary | ICD-10-CM | POA: Diagnosis not present

## 2020-01-19 DIAGNOSIS — Z658 Other specified problems related to psychosocial circumstances: Secondary | ICD-10-CM | POA: Diagnosis not present

## 2020-01-19 DIAGNOSIS — Z915 Personal history of self-harm: Secondary | ICD-10-CM | POA: Diagnosis not present

## 2020-01-19 DIAGNOSIS — F329 Major depressive disorder, single episode, unspecified: Secondary | ICD-10-CM | POA: Diagnosis not present

## 2020-01-19 DIAGNOSIS — R441 Visual hallucinations: Secondary | ICD-10-CM | POA: Diagnosis not present

## 2020-01-19 NOTE — Progress Notes (Signed)
Spoke with patient via Webex video call, used 2 identifiers to correctly identify patient. States that groups are going well. Effexor is working better for her than Prozac did. No side effects. On scale 1-10 as 10 being worst she rates depression at 2.5 and anxiety at 3. Denies SI/HI or AV hallucinations. Expecting to be discharged this Friday but unsure at this time if she will continue with IOP. PHQ9=10. No issues or complaints.

## 2020-01-19 NOTE — Therapy (Signed)
Virtual Visit via Video Note  I connected with Susan [name] on 01/19/20 at  11:00 AM EDT by a video enabled telemedicine application and verified that I am speaking with the correct person using two identifiers.   I discussed the limitations of evaluation and management by telemedicine and the availability of in person appointments. The patient expressed understanding and agreed to proceed.     I discussed the assessment and treatment plan with the patient. The patient was provided an opportunity to ask questions and all were answered. The patient agreed with the plan and demonstrated an understanding of the instructions.   The patient was advised to call back or seek an in-person evaluation if the symptoms worsen or if the condition fails to improve as anticipated.  I provided 55 minutes of non-face-to-face time during this encounter.     [place], [place], [address] Phone: [phone]   Fax:  [phone]  Occupational Therapy Treatment  Patient Details  Name: Susan [name] MRN: [id number] Date of Birth: Feb 17, 2001 Referring Provider (OT): Ricky Ala   Encounter Date: 01/19/2020  OT End of Session - 01/19/20 1409    Visit Number  4    Number of Visits  8    Date for OT Re-Evaluation  02/02/20    OT Start Time  1105    OT Stop Time  1155    OT Time Calculation (min)  50 min    Activity Tolerance  Patient tolerated treatment well    Behavior During Therapy  Susan [name] for tasks assessed/performed       Past Medical History:  Diagnosis Date  . Anxiety   . Mood disorder ([place])   . OCD (obsessive compulsive disorder)   . Vision abnormalities     History reviewed. No pertinent surgical history.  There were no vitals filed for this visit.  Subjective Assessment - 01/19/20 1409    Currently in Pain?  No/denies              S:  Sometimes I will take cash out to spend  versus using a credit card. It helps me spend within my limit.  O:  Patient participated in skilled OT group focusing on financial management.  Patient identified current methods of budgeting, and ways to improve budgeting.  Patient was educated on budgeting for necessities, creative ways of saving, and educated on use of a budget binder.  Patient was educated and directed to identify current income, review several months of expenditures, and then create budget. A: Patient was minimally engaged throughout session.  Susan [name] reflected that she does tend to spend too much on clothes that she feels are a need and really are a want.  Susan [name] shared with the group that one strategy she uses for conservative spending is to use cash instead of credit card.   P:  Continue skilled OT group 2 times per week for 3 weeks.  Next session, follow up on what new way patient is going to use to save money.  Group focus to be on time management.             OT Education - 01/19/20 1409    Education Details  financial management tips    Person(s) Educated  Patient    Methods  Explanation    Comprehension  Verbalized understanding       OT Short Term Goals - 01/13/20 2159  OT SHORT TERM GOAL #1   Title  Patient will be educated on strategies to improve psychosocial skills needed to participate fully in all daily, work, and leisure activities.    Time  4    Period  Weeks    Status  On-going    Target Date  02/02/20      OT SHORT TERM GOAL #2   Title  Pt will apply psychosocial skills and coping mechanisms to daily activities in order to function independently and reintegrate into community    Time  4    Period  Weeks    Status  On-going      OT SHORT TERM GOAL #3   Title  Pt will create and implement functional BADL/IADL routine upon reintegrating into the community for increased engagement in all daily, work, and leisure activities    Time  4    Period  Weeks    Status  On-going       OT SHORT TERM GOAL #4   Title  Pt will engage in goal setting to improve functional BADL/IADL routine upon reintegrating into community.    Time  4    Period  Weeks    Status  On-going      OT SHORT TERM GOAL #5   Title  Pt will recall and/or apply 1-3 sleep hygiene strategies to improve function in BADL routine upon reintegrating into community.    Time  4    Period  Weeks    Status  On-going               Plan - 01/19/20 1409    Body Structure / Function / Physical Skills  ADL    Cognitive Skills  Emotional;Energy/Drive;Temperament/Personality    Psychosocial Skills  Coping Strategies;Environmental  Adaptations;Habits;Interpersonal Interaction;Routines and Behaviors       Patient will benefit from skilled therapeutic intervention in order to improve the following deficits and impairments:   Body Structure / Function / Physical Skills: ADL Cognitive Skills: Emotional, Energy/Drive, Temperament/Personality Psychosocial Skills: Coping Strategies, Environmental  Adaptations, Habits, Interpersonal Interaction, Routines and Behaviors   Visit Diagnosis: Severe recurrent major depressive disorder with psychotic features (HCC)  Difficulty coping  Generalized anxiety disorder    Problem List Patient Active Problem List   Diagnosis Date Noted  . Severe recurrent major depressive disorder with psychotic features (HCC) 01/01/2020  . Generalized anxiety disorder 01/01/2020  . 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 [place], [place], [address] Phone: [phone]   Fax:  [phone]  Name: Susan [name] MRN: [id number] Date of Birth: Aug 22, 2001

## 2020-01-20 ENCOUNTER — Other Ambulatory Visit (HOSPITAL_COMMUNITY): Payer: 59 | Admitting: Licensed Clinical Social Worker

## 2020-01-20 ENCOUNTER — Other Ambulatory Visit: Payer: Self-pay

## 2020-01-20 ENCOUNTER — Encounter (HOSPITAL_COMMUNITY): Payer: Self-pay | Admitting: Family

## 2020-01-20 ENCOUNTER — Ambulatory Visit (HOSPITAL_COMMUNITY): Payer: 59

## 2020-01-20 DIAGNOSIS — R45851 Suicidal ideations: Secondary | ICD-10-CM | POA: Diagnosis not present

## 2020-01-20 DIAGNOSIS — Z915 Personal history of self-harm: Secondary | ICD-10-CM | POA: Diagnosis not present

## 2020-01-20 DIAGNOSIS — R4589 Other symptoms and signs involving emotional state: Secondary | ICD-10-CM

## 2020-01-20 DIAGNOSIS — R440 Auditory hallucinations: Secondary | ICD-10-CM | POA: Diagnosis not present

## 2020-01-20 DIAGNOSIS — F419 Anxiety disorder, unspecified: Secondary | ICD-10-CM | POA: Diagnosis not present

## 2020-01-20 DIAGNOSIS — F329 Major depressive disorder, single episode, unspecified: Secondary | ICD-10-CM | POA: Diagnosis not present

## 2020-01-20 DIAGNOSIS — F429 Obsessive-compulsive disorder, unspecified: Secondary | ICD-10-CM | POA: Diagnosis not present

## 2020-01-20 DIAGNOSIS — F411 Generalized anxiety disorder: Secondary | ICD-10-CM

## 2020-01-20 DIAGNOSIS — Z658 Other specified problems related to psychosocial circumstances: Secondary | ICD-10-CM | POA: Diagnosis not present

## 2020-01-20 DIAGNOSIS — R441 Visual hallucinations: Secondary | ICD-10-CM | POA: Diagnosis not present

## 2020-01-20 DIAGNOSIS — F333 Major depressive disorder, recurrent, severe with psychotic symptoms: Secondary | ICD-10-CM

## 2020-01-20 MED ORDER — QUETIAPINE FUMARATE 25 MG PO TABS: 25.0000 mg | ORAL_TABLET | Freq: Every day | ORAL | 0 refills | Status: AC

## 2020-01-20 MED FILL — QUETIAPINE FUMARATE 25 MG T: 25 | 30 days supply | Qty: 30 | Fill #0

## 2020-01-20 NOTE — Progress Notes (Signed)
Virtual Visit via Video Note  I connected with Susan [name] on 01/20/20 at  9:00 AM EDT by a video enabled telemedicine application and verified that I am speaking with the correct person using two identifiers.   I discussed the limitations of evaluation and management by telemedicine and the availability of in person appointments. The patient expressed understanding and agreed to proceed.   I discussed the assessment and treatment plan with the patient. The patient was provided an opportunity to ask questions and all were answered. The patient agreed with the plan and demonstrated an understanding of the instructions.   The patient was advised to call back or seek an in-person evaluation if the symptoms worsen or if the condition fails to improve as anticipated.  I provided 15 minutes of non-face-to-face time during this encounter.   Susan Rack, NP   [facility] Outpatient Program Discharge Summary  Susan [name] [id number]  Admission date: 01/13/20 Discharge date: 01/23/2020  Reason for admission: Per admission assessment: Susan [name] is a 19 y.o. Caucasian female presents with depression and anxiety.Reportes she was recently discharge from [facility] for suicidal ideations. Reported she was experiencing auditory and visual hallicaitons on admission. Today she is denying. Reported feeling better after her discharge. Reported that she as always struggled with anxiety however has never had treatment.  Patient was enrolled in partial psychiatric program on 01/13/2020   Progress in Program Toward Treatment Goals: Ongoing, patient has attended and participated with daily group session with active and engaged participation. She denies suicidal or homicidal ideations. Denies auditory or visual hallucinations. Patient rates her depression 3 out of 10 with 10 being the worst. Denied any medication side effects. Patient reports  monitoring additional coping skills to work on boundaries which she feels will be helpful while attending college. Susan [name] was started on Lamictal by her attending psychiatrist, patient is denying headaches, nausea or rash.   Progress (rationale): patient is considering stepping down to intensive outpatient programing. -NP will make medication refills available at discharge.  Take all medications as prescribed. Keep all follow-up appointments as scheduled.  Do not consume alcohol or use illegal drugs while on prescription medications. Report any adverse effects from your medications to your primary care provider promptly.  In the event of recurrent symptoms or worsening symptoms, call 911, a crisis hotline, or go to the nearest emergency department for evaluation.   Susan Rack, NP 01/20/2020

## 2020-01-21 ENCOUNTER — Other Ambulatory Visit (HOSPITAL_COMMUNITY): Payer: 59 | Admitting: Licensed Clinical Social Worker

## 2020-01-21 ENCOUNTER — Encounter (HOSPITAL_COMMUNITY): Payer: Self-pay

## 2020-01-21 ENCOUNTER — Other Ambulatory Visit (HOSPITAL_COMMUNITY): Payer: 59 | Admitting: Specialist

## 2020-01-21 ENCOUNTER — Other Ambulatory Visit: Payer: Self-pay

## 2020-01-21 DIAGNOSIS — Z658 Other specified problems related to psychosocial circumstances: Secondary | ICD-10-CM | POA: Diagnosis not present

## 2020-01-21 DIAGNOSIS — F329 Major depressive disorder, single episode, unspecified: Secondary | ICD-10-CM | POA: Diagnosis not present

## 2020-01-21 DIAGNOSIS — F411 Generalized anxiety disorder: Secondary | ICD-10-CM

## 2020-01-21 DIAGNOSIS — R440 Auditory hallucinations: Secondary | ICD-10-CM | POA: Diagnosis not present

## 2020-01-21 DIAGNOSIS — R441 Visual hallucinations: Secondary | ICD-10-CM | POA: Diagnosis not present

## 2020-01-21 DIAGNOSIS — R45851 Suicidal ideations: Secondary | ICD-10-CM | POA: Diagnosis not present

## 2020-01-21 DIAGNOSIS — Z915 Personal history of self-harm: Secondary | ICD-10-CM | POA: Diagnosis not present

## 2020-01-21 DIAGNOSIS — F333 Major depressive disorder, recurrent, severe with psychotic symptoms: Secondary | ICD-10-CM

## 2020-01-21 DIAGNOSIS — R4589 Other symptoms and signs involving emotional state: Secondary | ICD-10-CM | POA: Diagnosis not present

## 2020-01-21 DIAGNOSIS — F429 Obsessive-compulsive disorder, unspecified: Secondary | ICD-10-CM | POA: Diagnosis not present

## 2020-01-21 DIAGNOSIS — F419 Anxiety disorder, unspecified: Secondary | ICD-10-CM | POA: Diagnosis not present

## 2020-01-21 NOTE — Progress Notes (Signed)
PHP group 01/06/2020 Pt presented to [facility] virtual group, and logged out within the first 10 minutes. Pt reached out to inform cln that her power went out and she would return if it was restored. Pt did not return to group this day

## 2020-01-21 NOTE — Therapy (Addendum)
Virtual Visit via Video Note  I connected with Oval Linsey Bublitz on 01/21/20 at  1200 pM EDT by a video enabled telemedicine application and verified that I am speaking with the correct person using two identifiers.   I discussed the limitations of evaluation and management by telemedicine and the availability of in person appointments. The patient expressed understanding and agreed to proceed.  I discussed the assessment and treatment plan with the patient. The patient was provided an opportunity to ask questions and all were answered. The patient agreed with the plan and demonstrated an understanding of the instructions.   The patient was advised to call back or seek an in-person evaluation if the symptoms worsen or if the condition fails to improve as anticipated.  I provided 55 minutes of non-face-to-face time during this encounter.     [place], [place], [address] Phone: [phone]   Fax:  [phone]  Occupational Therapy Treatment  Patient Details  Name: Dorreen Valiente MRN: [id number] Date of Birth: December 21, 2000 Referring Provider (OT): Ricky Ala   Encounter Date: 01/21/2020  OT End of Session - 01/21/20 1541    Visit Number  5    Number of Visits  8    Date for OT Re-Evaluation  02/02/20    OT Start Time  1200    OT Stop Time  1255    OT Time Calculation (min)  55 min    Activity Tolerance  Patient tolerated treatment well    Behavior During Therapy  [facility] for tasks assessed/performed       Past Medical History:  Diagnosis Date  . Anxiety   . Mood disorder ([place])   . OCD (obsessive compulsive disorder)   . Vision abnormalities     History reviewed. No pertinent surgical history.  There were no vitals filed for this visit.  Subjective Assessment - 01/21/20 1540    Currently in Pain?  No/denies        s:  I plan my time down to the minute sometimes and become  frustrated when I fall behind schedule.   O:  Patient participated in skilled OT group focusing on time management this date.  Group consisted of warm up activity relating how you would spend $86,400 if you had it and couldn't have any left over at end of day - as we have 86,400 seconds in a day and need to spend them wisely.  Group discussed if time can be managed vs activities in a day being managed.  Group was educated on effects of good time management ( improved focus, decision making, success, decreased stress, increased free time) as well as effects of inefficient time management ( inefficiency, increased stress, and poor quality work).  Group discussed common time thieves and possible solutions, and finished with education on tips to improve time management ( know how you currently spend time, set priorities, use a scheduling tool, schedule time appropriately, get organized, delegate, avoid procrastination, manage time wasters, avoid multitasking, stay healthy).  Group concluded with members identifying one new time management strategy they plan to implement into daily routine.  A:  [name] was engaged throughout group.  [name] reflected that when she does an activity, for instance going to the gym, if she gets sidetracked by talking to someone and is late returning home for her next activity she becomes frustrated.  OT educated patient to build in time to her schedule for the unplanned (for  instance if gym takes 45 minutes, schedule it for 60 minutes instead).  [name] was receptive to this and committed to institute into her daily routine moving forward. P: Continue skilled OT group intervention with focus of next group on self esteem.                  OT Education - 01/21/20 1541    Education Details  time management strategies    Person(s) Educated  Patient    Methods  Explanation    Comprehension  Verbalized understanding       OT Short Term Goals - 01/13/20 2159      OT SHORT  TERM GOAL #1   Title  Patient will be educated on strategies to improve psychosocial skills needed to participate fully in all daily, work, and leisure activities.    Time  4    Period  Weeks    Status  On-going    Target Date  02/02/20      OT SHORT TERM GOAL #2   Title  Pt will apply psychosocial skills and coping mechanisms to daily activities in order to function independently and reintegrate into community    Time  4    Period  Weeks    Status  On-going      OT SHORT TERM GOAL #3   Title  Pt will create and implement functional BADL/IADL routine upon reintegrating into the community for increased engagement in all daily, work, and leisure activities    Time  4    Period  Weeks    Status  On-going      OT SHORT TERM GOAL #4   Title  Pt will engage in goal setting to improve functional BADL/IADL routine upon reintegrating into community.    Time  4    Period  Weeks    Status  On-going      OT SHORT TERM GOAL #5   Title  Pt will recall and/or apply 1-3 sleep hygiene strategies to improve function in BADL routine upon reintegrating into community.    Time  4    Period  Weeks    Status  On-going               Plan - 01/21/20 1541    Body Structure / Function / Physical Skills  ADL    Cognitive Skills  Emotional;Energy/Drive;Temperament/Personality    Psychosocial Skills  Coping Strategies;Environmental  Adaptations;Habits;Interpersonal Interaction;Routines and Behaviors       Patient will benefit from skilled therapeutic intervention in order to improve the following deficits and impairments:   Body Structure / Function / Physical Skills: ADL Cognitive Skills: Emotional, Energy/Drive, Temperament/Personality Psychosocial Skills: Coping Strategies, Environmental  Adaptations, Habits, Interpersonal Interaction, Routines and Behaviors   Visit Diagnosis: Difficulty coping  Generalized anxiety disorder  Severe recurrent major depressive disorder with psychotic  features [facility])    Problem List Patient Active Problem List   Diagnosis Date Noted  . Severe recurrent major depressive disorder with psychotic features ([place]) 01/01/2020  . Generalized anxiety disorder 01/01/2020  . 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  Patient goals were met. Patient is being discharged due to meeting the stated rehab goals.  ?????    Vangie Bicker, MHA, OTR/L [phone]  Date of Birth: 2001-09-17

## 2020-01-22 ENCOUNTER — Other Ambulatory Visit: Payer: Self-pay

## 2020-01-22 ENCOUNTER — Other Ambulatory Visit (HOSPITAL_COMMUNITY): Payer: 59 | Attending: Psychiatry | Admitting: Licensed Clinical Social Worker

## 2020-01-22 ENCOUNTER — Other Ambulatory Visit (HOSPITAL_COMMUNITY): Payer: 59

## 2020-01-22 DIAGNOSIS — F419 Anxiety disorder, unspecified: Secondary | ICD-10-CM | POA: Insufficient documentation

## 2020-01-22 DIAGNOSIS — F333 Major depressive disorder, recurrent, severe with psychotic symptoms: Secondary | ICD-10-CM

## 2020-01-22 DIAGNOSIS — Z880 Allergy status to penicillin: Secondary | ICD-10-CM | POA: Diagnosis not present

## 2020-01-22 DIAGNOSIS — F429 Obsessive-compulsive disorder, unspecified: Secondary | ICD-10-CM | POA: Insufficient documentation

## 2020-01-22 DIAGNOSIS — F329 Major depressive disorder, single episode, unspecified: Secondary | ICD-10-CM | POA: Diagnosis not present

## 2020-01-22 DIAGNOSIS — Z79899 Other long term (current) drug therapy: Secondary | ICD-10-CM | POA: Diagnosis not present

## 2020-01-22 DIAGNOSIS — F411 Generalized anxiety disorder: Secondary | ICD-10-CM

## 2020-01-22 NOTE — Psych (Signed)
Virtual Visit via Video Note  I connected with Susan [name] on 01/07/20 at  9:00 AM EDT by a video enabled telemedicine application and verified that I am speaking with the correct person using two identifiers.   I discussed the limitations of evaluation and management by telemedicine and the availability of in person appointments. The patient expressed understanding and agreed to proceed.  I discussed the assessment and treatment plan with the patient. The patient was provided an opportunity to ask questions and all were answered. The patient agreed with the plan and demonstrated an understanding of the instructions.   The patient was advised to call back or seek an in-person evaluation if the symptoms worsen or if the condition fails to improve as anticipated.  Pt was provided 240 minutes of non-face-to-face time during this encounter.   Susan Guiles, LCSW    [facility] PHP THERAPIST PROGRESS NOTE  Susan [name] [id number]  Session Time: 9:00 - 10:00  Participation Level: Active  Behavioral Response: CasualAlertDepressed  Type of Therapy: Group Therapy  Treatment Goals addressed: Coping  Interventions: CBT, DBT, Supportive and Reframing  Summary: Clinician led check-in regarding current stressors and situation, and review of patient completed daily inventory. Clinician utilized active listening and empathetic response and validated patient emotions. Clinician facilitated processing group on pertinent issues.   Therapist Response:Susan [name] is a 19 y.o. female who presents with depression and anxiety symptoms. Patient arrived within time allowed and reports that she is feeling "rested." Patient rates hermood at [facility] a scale of 1-10 with 10 being great. Pt reportsher weekend was "pretty good." Pt reports she was without power for most of the day yesterday and also had her first day back in classes. Pt states her anxiety was high due to the return,  however it eased as the day progressed. Pt reports struggling with anxiety. Pt is able to process. Patient engaged in discussion.       Session Time: 10:00 -11:00  Participation Level:Minimal  Behavioral Response:CasualAlertDepressed  Type of Therapy: Group Therapy, psychoeducation, psychotherapy  Treatment Goals addressed: Coping  Interventions:CBT, DBT, Solution Focused, Supportive and Reframing  Summary:Cln introduced DBT's [facility] practice as a way to increase interpersonal effectiveness. Group members discussed issues they are having with feeling understood in certain relationships and group worked to apply [facility] model to address those concerns.   Therapist Response: Patientreports understanding of [facility].    Pt was inconsistently in group from 11-1 due to technical issues. Pt was non-responsive to verbal cues and video was inaccessible at check-out.     Suicidal/Homicidal: Nowithout intent/plan  Plan: Pt will continue in PHP while working to increase stability post-discharge from inpatient psychiatric admission, decrease depression and anxiety symptoms, and increase ability to manage symptoms in a healthy manner when they arise.   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 Severe recurrent major depressive disorder with psychotic features (HCC)   2. Generalized anxiety disorder       Susan Guiles, LCSW 01/22/2020

## 2020-01-23 ENCOUNTER — Other Ambulatory Visit (HOSPITAL_COMMUNITY): Payer: 59

## 2020-01-23 ENCOUNTER — Other Ambulatory Visit (HOSPITAL_COMMUNITY): Payer: 59 | Admitting: Licensed Clinical Social Worker

## 2020-01-23 ENCOUNTER — Other Ambulatory Visit: Payer: Self-pay

## 2020-01-23 DIAGNOSIS — Z79899 Other long term (current) drug therapy: Secondary | ICD-10-CM | POA: Diagnosis not present

## 2020-01-23 DIAGNOSIS — F411 Generalized anxiety disorder: Secondary | ICD-10-CM

## 2020-01-23 DIAGNOSIS — F329 Major depressive disorder, single episode, unspecified: Secondary | ICD-10-CM | POA: Diagnosis not present

## 2020-01-23 DIAGNOSIS — F429 Obsessive-compulsive disorder, unspecified: Secondary | ICD-10-CM | POA: Diagnosis not present

## 2020-01-23 DIAGNOSIS — Z880 Allergy status to penicillin: Secondary | ICD-10-CM | POA: Diagnosis not present

## 2020-01-23 DIAGNOSIS — F419 Anxiety disorder, unspecified: Secondary | ICD-10-CM | POA: Diagnosis not present

## 2020-01-23 DIAGNOSIS — F333 Major depressive disorder, recurrent, severe with psychotic symptoms: Secondary | ICD-10-CM

## 2020-01-26 ENCOUNTER — Telehealth (HOSPITAL_COMMUNITY): Payer: Self-pay | Admitting: Psychiatry

## 2020-01-26 MED FILL — VENLAFAXINE HCL ER 75 MG CA: 75 | 90 days supply | Qty: 90 | Fill #0

## 2020-01-26 NOTE — Telephone Encounter (Signed)
D:  Pt was transitioned to MH-IOP.  She will start tomorrow.  A:  Placed call to orient patient, but there was no answer.  Left vm with case manager's name and phone number if she had any questions.

## 2020-01-27 ENCOUNTER — Other Ambulatory Visit (HOSPITAL_COMMUNITY): Payer: 59 | Admitting: Licensed Clinical Social Worker

## 2020-01-27 ENCOUNTER — Encounter (HOSPITAL_COMMUNITY): Payer: Self-pay | Admitting: Psychiatry

## 2020-01-27 ENCOUNTER — Other Ambulatory Visit: Payer: Self-pay

## 2020-01-27 ENCOUNTER — Ambulatory Visit (HOSPITAL_COMMUNITY): Payer: 59

## 2020-01-27 DIAGNOSIS — F429 Obsessive-compulsive disorder, unspecified: Secondary | ICD-10-CM | POA: Diagnosis not present

## 2020-01-27 DIAGNOSIS — F419 Anxiety disorder, unspecified: Secondary | ICD-10-CM | POA: Diagnosis not present

## 2020-01-27 DIAGNOSIS — F329 Major depressive disorder, single episode, unspecified: Secondary | ICD-10-CM | POA: Diagnosis not present

## 2020-01-27 DIAGNOSIS — Z79899 Other long term (current) drug therapy: Secondary | ICD-10-CM | POA: Diagnosis not present

## 2020-01-27 DIAGNOSIS — F411 Generalized anxiety disorder: Secondary | ICD-10-CM

## 2020-01-27 DIAGNOSIS — F333 Major depressive disorder, recurrent, severe with psychotic symptoms: Secondary | ICD-10-CM

## 2020-01-27 DIAGNOSIS — Z880 Allergy status to penicillin: Secondary | ICD-10-CM | POA: Diagnosis not present

## 2020-01-27 NOTE — Progress Notes (Signed)
Virtual Visit via Video Note  I connected with Susan [name] on @TODAY @ at 0800 by a video enabled telemedicine application and verified that I am speaking with the correct person using two identifiers.  I discussed the limitations of evaluation and management by telemedicine and the availability of in person appointments. The patient expressed understanding and agreed to proceed. I discussed the assessment and treatment plan with the patient. The patient was provided an opportunity to ask questions and all were answered. The patient agreed with the plan and demonstrated an understanding of the instructions.  The patient was advised to call back or seek an in-person evaluation if the symptoms worsen or if the condition fails to improve as anticipated.  I provided 20 minutes of non-face-to-face time during this encounter.    Patient ID: , female   DOB: 12-Dec-2000, 18 y.o.   MRN: [id number] As per admit note states: Pt reports to [facility] per [facility]; pt was inpt for 1 week due to SI. Pt reports 1 attempt in 8th grade by cutting wrists but stopped self, no medical attention needed. Pt denies other hospitalizations. Pt reports seeing [facility] for a few years- discharged in 2019 because pt was reporting she was "fine" due to fears therapist would share information from appointment with mother due to being a minor. Pt does not have a current therapist. Currently sees Dr. 2020 for psychiatry. Pt reports severe decrease in ADLs, including not showering for a few weeks. Pt reports stressors include school, college applications, and thoughts about the future. Pt reports [facility] gave her diagnosis of Borderline Personality Disorder. Pt shares she has passive SI daily, without intent/plan. AVH: Pt shares she has been seeing and hearing her dog around the house since the dog's death in 20-Jun-2019. Pt shares she sees someone's "energy behind them- like a fog" for a few  weeks now. Pt reports self-harm by cutting in past; most recently a month ago; denies current thoughts. Pt shares she has burned self freshman year; picks at nails until they bleed; and will slap/punch face (daily occurrence). Family history: Paternal Grandfather: bipolar and OCD; Paternal Grandmother: anxiety and OCD; Maternal Grandmother: depression. Pt reports increased isolation due to depression and COVID. Pt denies current SI with plan/intent or HI. Patients Currently Reported Symptoms/Problems: SI without plan/intent; recent self-harm acts; increased depression and anxiety; severely decreased ADLs; lacks motivation; no energy; "feels heavy, my body and my brain," can't get out of bed; lacks concentration; reports short term memory issues; feelings of hopelessness/worthlessness; no appetite currently (pt reports 30lb weight gain since March 2020 but does not have appetite currently), sleep issues (getting better with increased Seroquel); hallucinations.  Pt transitioned from PHP to MH-IOP today.  Reports mood is improving.  Denies SI/HI or A/V hallucinations.  Applying coping skills learned. A:  Oriented pt to MH-IOP.  Encouraged pt to verify benefits.  Pt gave verbal consent for treatment, to release chart information to referred providers and to complete any forms if needed.  Pt also gave consent for attending group virtually d/t COVID-19 social distancing restrictions.  Encouraged support groups.  Will refer pt to a psychiatrist and therapist if she doesn't have one.  R:  Pt receptive.  April 2020, M.Ed,CNA

## 2020-01-27 NOTE — Progress Notes (Signed)
Virtual Visit via Video Note  I connected with Susan [name] on 01/27/20 at  9:00 AM EDT by a video enabled telemedicine application and verified that I am speaking with the correct person using two identifiers.   I discussed the limitations of evaluation and management by telemedicine and the availability of in person appointments. The patient expressed understanding and agreed to proceed.   I discussed the assessment and treatment plan with the patient. The patient was provided an opportunity to ask questions and all were answered. The patient agreed with the plan and demonstrated an understanding of the instructions.   The patient was advised to call back or seek an in-person evaluation if the symptoms worsen or if the condition fails to improve as anticipated.  I provided 180 minutes of non-face-to-face time during this encounter.   Harlon Ditty, LCSW     Daily Group Progress Note  Program: IOP  Group Time: 9am-12pm  Participation Level: Active  Behavioral Response: Appropriate  Type of Therapy:  Process group, psycho-educational group The purpose of this group is to utilize CBT and DBT skills to increase use of healthy coping skills to maintain stable mood and decrease frequency and intensity of problematic mental health symptoms. 9am-10:30am Process group Clinician checked in with group members, assessing for SI/HI/psychosis and overall level of functioning. Clinician inquired about things going well in client lives and things which could be going better. Clinician utilized summarizing and reflective statements in process. Clinician allowed clients time to discuss barriers to progress and needs in treatment. Clinician provided Progressive Muscle Relaxation activity as a mindfulness practice in session.  10:30am-12pm Psycho-Educational Group Clinician presented the psycho-educational topic of Willingness vs Willfulness. Clinician and group members defined willingness  and willfulness and recent moments of each. Clinician provided supplemental video on 'Willingness as an Antidote to Anxiety.' Clinician and group members discussed the practice of sitting in uncomfortable emotions or physical sensations and resisting the urge to act impulsively. Group members completed activity presented in video and clinician reviewed additional skill of Half-smiling and Willing hands. Clinician inquired about self care activity to be completed before next group session.   Summary of Progress: Client checked in with stressors related to focus and decision making but what is going well is her improved ability to manage irritability rather than previously being aggressively reactive. Client engaged in group discussions with minimal prompting and identified self care activity to complete this evening.  Harlon Ditty, LCSW

## 2020-01-27 NOTE — Psych (Signed)
Virtual Visit via Video Note  I connected with [name] on 01/13/20 at  9:00 AM EDT by a video enabled telemedicine application and verified that I am speaking with the correct person using two identifiers.   I discussed the limitations of evaluation and management by telemedicine and the availability of in person appointments. The patient expressed understanding and agreed to proceed.  I discussed the assessment and treatment plan with the patient. The patient was provided an opportunity to ask questions and all were answered. The patient agreed with the plan and demonstrated an understanding of the instructions.   The patient was advised to call back or seek an in-person evaluation if the symptoms worsen or if the condition fails to improve as anticipated.  Pt was provided 240 minutes of non-face-to-face time during this encounter.   Donia Guiles, LCSW    [facility] PHP THERAPIST PROGRESS NOTE  Susan [name] [id number]  Session Time: 9:00 - 10:00  Participation Level: Active  Behavioral Response: CasualAlertDepressed  Type of Therapy: Group Therapy  Treatment Goals addressed: Coping  Interventions: CBT, DBT, Supportive and Reframing  Summary: Clinician led check-in regarding current stressors and situation, and review of patient completed daily inventory. Clinician utilized active listening and empathetic response and validated patient emotions. Clinician facilitated processing group on pertinent issues.   Therapist Response:Susan [name] is a 19 y.o. female who presents with depression and anxiety symptoms. Patient arrived within time allowed and reports that she is feeling "good." Patient rates hermood at [facility] a scale of 1-10 with 10 being great. Pt reportsshe heard back from her last college interview and that relieved some stress. Pt states she was able to get some of her projects done yesterday but not all of them. Pt struggles to share in  group. Pt is able to process. Patient engaged in discussion.       Session Time: 10:00-11:00  Participation Level:Active  Behavioral Response:CasualAlertDepressed  Type of Therapy: Group Therapy  Treatment Goals addressed: Coping  Interventions:CBT, DBT, Solution Focused, Supportive and Reframing  Summary:Cln led discussion on ways to remind yourself to utilize new skills. Group brainstormed ways such as reminders on your phone, post-it notes, lists, utilizing support system, mantras, and accronyms.  Cln encouraged pt's to consider the practical aspect of these reminders and to set intentions about setting them up.   Therapist Response:  Pt engaged in discussion and reports mantras and lists are two ways she can increase the use of new skills.       Session Time: 11:00- 12:00  Participation Level:Active  Behavioral Response:CasualAlertDepressed  Type of Therapy: Group Therapy, OT  Treatment Goals addressed: Coping  Interventions:Psychosocial skills training, Supportive  Summary:Occupational Therapy group  Therapist Response:Patient engaged in group. See OT note.       Session Time: 12:00 -1:00  Participation Level:Active  Behavioral Response:CasualAlertDepressed  Type of Therapy: Group therapy  Treatment Goals addressed: Coping  Interventions:CBT; Solution focused; Supportive; Reframing  Summary:12:00 - 12:50: Cln continued topic of cognitive distortions. Group reviewed handout "Unhealthy thought patterns" to continue discussion of common cognitive distortions. Group shared personal examples of how these distortions play out in their lives and how it causes negative impact. 12:50 -1:00 Clinician led check-out. Clinician assessed for immediate needs, medication compliance and efficacy, and safety concerns  Therapist Response:12:00 - 12:50: Pt engaged in discussion and is able to determine examples from  their own lives. Pt identifies self-centeredness and black and white thinking as most problematic for them.  12:50 - 1:00: At check-out, patientrates hermood at a 7.5on a scale of 1-10 with 10 being great.Pt states afternoon plans ofgoing to the gym and class.Patient demonstratessomeprogress as evidenced byimproved sleep.Patient denies SI/HI/self-harm at the end of group.    Suicidal/Homicidal: Nowithout intent/plan  Plan: Pt will continue in PHP while working to increase stability post-discharge from inpatient psychiatric admission, decrease depression and anxiety symptoms, and increase ability to manage symptoms in a healthy manner when they arise.   Diagnosis: Severe recurrent major depressive disorder with psychotic features ([place]) [F33.3]    1. Severe recurrent major depressive disorder with psychotic features ([place])   2. Difficulty coping   3. Generalized anxiety disorder       [name], LCSW 01/27/2020

## 2020-01-27 NOTE — Progress Notes (Signed)
Psychiatric Initial Adult Assessment   Patient Identification: Susan [name] MRN:  [id number] Date of Evaluation:  01/27/2020 Referral Source: PHP step down Chief Complaint:   Chief Complaint    Anxiety; Depression; Stress     Visit Diagnosis: No diagnosis found.  History of Present Illness:  Per admission assessment note: Susan [name] is a 19 y.o. Caucasian female presents with depression and anxiety.Reportes she was recently discharge from [facility] for suicidal ideations. Reported she was experiencing auditory and visual hallicaitons on admission. Today she is denying. Reported feeling better after her discharge. Reported that she as always struggled with anxiety however has never had treatmentPatient was enrolled in partial psychiatric program on 01/13/20.  Evaluation: Susan [name] is transitions for partial hosptilization. Reported over all her mood continues to improve daily. Patient was initiated on Lamictal by her psychiatrist she reported taken and tolerating medications well. Rating her depression 3/10 with 10 benign the worst. Denying suicidal or homicidal ideations.    Associated Signs/Symptoms: Depression Symptoms:  depressed mood, difficulty concentrating, anxiety, (Hypo) Manic Symptoms:  Distractibility, Anxiety Symptoms:  Excessive Worry, Psychotic Symptoms:  Hallucinations: None PTSD Symptoms: NA  Past Psychiatric History:   Previous Psychotropic Medications: No   Substance Abuse History in the last 12 months:  No.  Consequences of Substance Abuse: NA  Past Medical History:  Past Medical History:  Diagnosis Date  . Anxiety   . Mood disorder (HCC)   . OCD (obsessive compulsive disorder)   . Vision abnormalities    History reviewed. No pertinent surgical history.  Family Psychiatric History:   Family History:  Family History  Problem Relation Age of Onset  . Hypertension Maternal Uncle   . Arthritis Maternal Grandmother   . Heart  disease Maternal Grandmother   . Heart disease Maternal Grandfather   . Stroke Maternal Grandfather   . Depression Paternal Grandfather   . Bipolar disorder Paternal Grandfather   . Asthma Neg Hx   . Cancer Neg Hx   . Diabetes Neg Hx   . Early death Neg Hx   . Hyperlipidemia Neg Hx   . Kidney disease Neg Hx     Social History:   Social History   Socioeconomic History  . Marital status: Single    Spouse name: Not on file  . Number of children: Not on file  . Years of education: Not on file  . Highest education level: Not on file  Occupational History  . Not on file  Tobacco Use  . Smoking status: Never Smoker  . Smokeless tobacco: Never Used  Substance and Sexual Activity  . Alcohol use: No  . Drug use: No  . Sexual activity: Never  Other Topics Concern  . Not on file  Social History Narrative   [employer]    Lives with mother,father   2 puppies   Softball, piano.   Social Determinants of Health   Financial Resource Strain:   . Difficulty of Paying Living Expenses:   Food Insecurity:   . Worried About Programme researcher, broadcasting/film/video in the Last Year:   . Barista in the Last Year:   Transportation Needs:   . Freight forwarder (Medical):   Marland Kitchen Lack of Transportation (Non-Medical):   Physical Activity:   . Days of Exercise per Week:   . Minutes of Exercise per Session:   Stress:   . Feeling of Stress :   Social Connections:   . Frequency of Communication with Friends and Family:   .  Frequency of Social Gatherings with Friends and Family:   . Attends Religious Services:   . Active Member of Clubs or Organizations:   . Attends Archivist Meetings:   Marland Kitchen Marital Status:     Additional Social History:   Allergies:   Allergies  Allergen Reactions  . Kiwi Extract Other (See Comments)    Throat burns and swells a bit  . Latex     Mother said that her child had a reaction to latex, and only uses latex free band-aids  . Penicillins     Mother  said child had a reaction to penicillin as infant. Has not been tested for allergy yet.    Metabolic Disorder Labs: No results found for: HGBA1C, MPG No results found for: PROLACTIN No results found for: CHOL, TRIG, HDL, CHOLHDL, VLDL, LDLCALC No results found for: TSH  Therapeutic Level Labs: No results found for: LITHIUM No results found for: CBMZ No results found for: VALPROATE  Current Medications: Current Outpatient Medications  Medication Sig Dispense Refill  . ALPRAZolam (XANAX) 0.25 MG tablet Take 0.25 mg by mouth 3 (three) times daily as needed for anxiety (panic).   3  . FLUoxetine (PROZAC) 10 MG capsule Take 10-20 mg by mouth See admin instructions. 10mg  in the morning and 20mg  at night  99  . lamoTRIgine (LAMICTAL) 100 MG tablet Take 1 tablet in the morning and 2 tablets at nighttime (Patient taking differently: Take 100-200 mg by mouth See admin instructions. Take 100mg  in the morning and 200mg  at night) 93 tablet 5  . Norgestimate-Ethinyl Estradiol Triphasic (TRI-PREVIFEM) 0.18/0.215/0.25 MG-35 MCG tablet Take 1 tablet daily by mouth.    . QUEtiapine (SEROQUEL) 25 MG tablet Take 1 tablet (25 mg total) by mouth at bedtime. 30 tablet 0  . venlafaxine XR (EFFEXOR-XR) 75 MG 24 hr capsule Take 75 mg by mouth daily.     No current facility-administered medications for this visit.    Musculoskeletal:   Psychiatric Specialty Exam: Review of Systems  There were no vitals taken for this visit.There is no height or weight on file to calculate BMI.  General Appearance: Casual  Eye Contact:  Fair  Speech:  Clear and Coherent  Volume:  Normal  Mood:  Anxious and Depressed  Affect:  Congruent  Thought Process:  Coherent  Orientation:  Full (Time, Place, and Person)  Thought Content:  Logical  Suicidal Thoughts:  No  Homicidal Thoughts:  No  Memory:  Immediate;   Fair Recent;   Fair  Judgement:  Fair  Insight:  Fair  Psychomotor Activity:  Normal  Concentration:   Concentration: Fair  Recall:  [employer] of Knowledge:Fair  Language: Fair  Akathisia:  No  Handed:  Right  AIMS (if indicated):    Assets:  Communication Skills Desire for Improvement Social Support  ADL's:  Intact  Cognition: WNL  Sleep:  Fair   Screenings: PHQ2-9     Counselor from 01/19/2020 in [place] Counselor from 01/07/2020 in [place] [facility] from [facility]/11/2016 [facility]  PHQ-2 Total Score  3  3  6   PHQ-9 Total Score  10  10  --      Assessment and Plan:  [place] to start Intensive Outpatient Program -Continue medication as directed  Treatment plan was reviewed and agreed upon by NP  T.Bobby Rumpf and patient Aubreyanna Dorrough need for group services.     Derrill [place], NP 4/6/202111:[id number]  AM
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
## 2020-01-28 ENCOUNTER — Other Ambulatory Visit (HOSPITAL_COMMUNITY): Payer: 59 | Admitting: Licensed Clinical Social Worker

## 2020-01-28 ENCOUNTER — Other Ambulatory Visit: Payer: Self-pay

## 2020-01-28 DIAGNOSIS — Z79899 Other long term (current) drug therapy: Secondary | ICD-10-CM | POA: Diagnosis not present

## 2020-01-28 DIAGNOSIS — F411 Generalized anxiety disorder: Secondary | ICD-10-CM

## 2020-01-28 DIAGNOSIS — F429 Obsessive-compulsive disorder, unspecified: Secondary | ICD-10-CM | POA: Diagnosis not present

## 2020-01-28 DIAGNOSIS — F419 Anxiety disorder, unspecified: Secondary | ICD-10-CM | POA: Diagnosis not present

## 2020-01-28 DIAGNOSIS — F333 Major depressive disorder, recurrent, severe with psychotic symptoms: Secondary | ICD-10-CM

## 2020-01-28 DIAGNOSIS — F329 Major depressive disorder, single episode, unspecified: Secondary | ICD-10-CM | POA: Diagnosis not present

## 2020-01-28 DIAGNOSIS — Z880 Allergy status to penicillin: Secondary | ICD-10-CM | POA: Diagnosis not present

## 2020-01-28 NOTE — Psych (Signed)
Virtual Visit via Video Note  I connected with Susan [name] on 01/14/20 at  9:00 AM EDT by a video enabled telemedicine application and verified that I am speaking with the correct person using two identifiers.   I discussed the limitations of evaluation and management by telemedicine and the availability of in person appointments. The patient expressed understanding and agreed to proceed.  I discussed the assessment and treatment plan with the patient. The patient was provided an opportunity to ask questions and all were answered. The patient agreed with the plan and demonstrated an understanding of the instructions.   The patient was advised to call back or seek an in-person evaluation if the symptoms worsen or if the condition fails to improve as anticipated.  Pt was provided 240 minutes of non-face-to-face time during this encounter.   Donia Guiles, LCSW    [facility] PHP THERAPIST PROGRESS NOTE  Hilda Rynders [id number]  Session Time: 9:00 - 10:00  Participation Level: Active  Behavioral Response: CasualAlertDepressed  Type of Therapy: Group Therapy  Treatment Goals addressed: Coping  Interventions: CBT, DBT, Supportive and Reframing  Summary: Clinician led check-in regarding current stressors and situation, and review of patient completed daily inventory. Clinician utilized active listening and empathetic response and validated patient emotions. Clinician facilitated processing group on pertinent issues.   Therapist Response:Susan [name] is a 19 y.o. female who presents with depression and anxiety symptoms. Patient arrived within time allowed and reports that she is feeling "tired." Patient rates hermood at [facility] a scale of 1-10 with 10 being great. Pt reportsdid not sleep well due to noises in the house keeping her up and woke up feeling stressed. Pt states her afternoon was "uneventful." Pt struggles to identify feelings and triggers. Pt is able  to process. Patient engaged in discussion.       Session Time: 10:00 -11:00  Participation Level:Minimal  Behavioral Response:CasualAlertDepressed  Type of Therapy: Group Therapy, psychoeducation, psychotherapy  Treatment Goals addressed: Coping  Interventions:CBT, DBT, Solution Focused, Supportive and Reframing  Summary:Cln led discussion on ways to utilize creativity for coping. Cln introduced the ways in which creativity can benefit our processing. Group discussed creative outlets they have or would like to harness and how to remove barriers to practicing it.   Therapist Response: Pt minimally engaged in discussion and identifies art as creative outlets they can utilize.       Session Time: 11:00 -12:00  Participation Level:Minimal  Behavioral Response:CasualAlertDepressed  Type of Therapy: Group Therapy, psychotherapy  Treatment Goals addressed: Coping  Interventions:Strengths based, reframing, Supportive,   Summary:Spiritual Care group  Therapist Response: Patient engaged in group. See chaplain note.        Session Time: 12:00- 1:00  Participation Level:Minimal  Behavioral Response:CasualAlertDepressed  Type of Therapy: Group Therapy, Psychoeducation  Treatment Goals addressed: Coping  Interventions:relaxation training; Supportive; Reframing  Summary:12:00 - 12:50: Relaxation group: Cln led group focused on retraining the body's response to stress.  12:50 -1:00 Clinician led check-out. Clinician assessed for immediate needs, medication compliance and efficacy, and safety concerns  Therapist Response:Pt engaged in activity and discussion. 12:50 - 1:00: At check-out, patientwas non-responsive and unable to complete check-out.    Suicidal/Homicidal: Nowithout intent/plan  Plan: Pt will continue in PHP while working to increase stability post-discharge from inpatient psychiatric admission,  decrease depression and anxiety symptoms, and increase ability to manage symptoms in a healthy manner when they arise.   Diagnosis: Severe recurrent major depressive disorder with psychotic features ([facility]) [F33.3]  1. Severe recurrent major depressive disorder with psychotic features ([place])   2. Generalized anxiety disorder       Susan Glass, LCSW 01/28/2020

## 2020-01-28 NOTE — Progress Notes (Signed)
Virtual Visit via Video Note   I connected with Susan [name], who prefers to go by "Susan [name]" on 01/28/20 at 9:00 AM EST by a video enabled telemedicine application and verified that I am speaking with the correct person using two identifiers.   Location: Patient: Patient Home Provider: [facility] [place] Office   Case Manager discussed the limitations of evaluation and management by telemedicine and the availability of in person appointments during orientation. The patient expressed understanding and agreed to proceed.   History of Present Illness: MDD, GAD    Observations/Objective: Case Manager checked in with all participants to review discharge dates, insurance authorizations, work-related documents and needs for the treatment team. Counselor facilitated a check-in with group members to gauge mood and current functioning as well as identify recent progress towards treatment goals.  Dorinne, who prefers to go by "Susan [name]" presented to group session on time and was alert, oriented x5, with no evidence or self-report of SI/HI or A/V H.  Susan [name] reported scores of 3/10 for depression and 8/10 for anxiety today.  Susan [name] reported that she enjoyed her first day of group yesterday and is still in the process of identifying goals she would like to work on moving forward through treatment.  She reported that her plan today is to go to her school to get her cap and gown, which she is anxious about, as being on campus tends to make her nervous.  Susan [name] reported that she plans to treat herself to a nice lunch afterward to balance this out, and then go to the gym to de-stress, before attending a psychiatry appointment later in afternoon.    Psycho-educational portion of group was provided by pharmacist, Einar Grad. Pharmacist provided psychoeducation on classes of medications such as antidepressants, antipsychotics, what symptoms they are intended to treat, and any side effects one might encounter while on prescription.   Time was allowed for clients to ask any questions they might have of pharmacist.  Susan [name] did not participate in discussion with pharmacist, but was observed actively listening to material covered.      Counselor invited members to participate in peaceful place guided imagery activity today.  Counselor explained how this is a powerful visualization tool which can aid in reducing stress while increasing sense of calm, control, and awareness if practiced regularly.  Counselor informed members beforehand that if they became uncomfortable at any point during activity, they could stop and open their eyes.  Counselor invited members to get comfortable, achieve a relaxing breathing rhythm, close their eyes, and then guided them through process of creating a 'peaceful place' which filled them with safety and calm.  Counselor encouraged members to include sensory details involving vision, sound, touch, smell, and taste which they considered pleasant to enhance experience.  After 10 minutes of practice in session, counselor invited members to share their opinion on the activity, including whether they were able to imagine a specific place, what details stood out to them, and how this made them feel during and after.   Intervention was effective, as evidenced by Susan [name] participating in activity.  Susan [name] reported that although she was unable to picture a safe space in her mind, she did end up feeling more relaxed afterward, and this also reduced some body pain she had been struggling with.    Assessment and Plan: Counselor recommends that patient remains in IOP treatment to better manage mental health symptoms and continue to address treatment plan goals. Counselor recommends adherence to crisis/safety plan, taking medications as  prescribed and following up with medical professionals if any issues arise.    Follow Up Instructions: Counselor will send Webex link for next session.  The patient was advised to call back or  seek an in-person evaluation if the symptoms worsen or if the condition fails to improve as anticipated.   I provided 180 minutes of non-face-to-face time during this encounter.     Noralee Stain, LCSW, LCAS

## 2020-01-29 ENCOUNTER — Other Ambulatory Visit (HOSPITAL_COMMUNITY): Payer: 59 | Admitting: Psychiatry

## 2020-01-29 ENCOUNTER — Telehealth (HOSPITAL_COMMUNITY): Payer: Self-pay | Admitting: Psychiatry

## 2020-01-29 ENCOUNTER — Ambulatory Visit (HOSPITAL_COMMUNITY): Payer: 59

## 2020-01-29 ENCOUNTER — Other Ambulatory Visit: Payer: Self-pay

## 2020-01-29 MED FILL — QUETIAPINE FUMARATE 50 MG T: 50 | 30 days supply | Qty: 30 | Fill #0

## 2020-01-30 ENCOUNTER — Ambulatory Visit (HOSPITAL_COMMUNITY): Payer: 59

## 2020-01-30 ENCOUNTER — Other Ambulatory Visit (HOSPITAL_COMMUNITY): Payer: 59 | Admitting: Licensed Clinical Social Worker

## 2020-01-30 ENCOUNTER — Other Ambulatory Visit: Payer: Self-pay

## 2020-01-30 DIAGNOSIS — Z880 Allergy status to penicillin: Secondary | ICD-10-CM | POA: Diagnosis not present

## 2020-01-30 DIAGNOSIS — F419 Anxiety disorder, unspecified: Secondary | ICD-10-CM | POA: Diagnosis not present

## 2020-01-30 DIAGNOSIS — F329 Major depressive disorder, single episode, unspecified: Secondary | ICD-10-CM | POA: Diagnosis not present

## 2020-01-30 DIAGNOSIS — F333 Major depressive disorder, recurrent, severe with psychotic symptoms: Secondary | ICD-10-CM

## 2020-01-30 DIAGNOSIS — Z79899 Other long term (current) drug therapy: Secondary | ICD-10-CM | POA: Diagnosis not present

## 2020-01-30 DIAGNOSIS — F429 Obsessive-compulsive disorder, unspecified: Secondary | ICD-10-CM | POA: Diagnosis not present

## 2020-01-30 DIAGNOSIS — F411 Generalized anxiety disorder: Secondary | ICD-10-CM

## 2020-01-30 NOTE — Progress Notes (Signed)
Virtual Visit via Video Note   I connected with Susan [name], who prefers to go by "Susan [name]" on 01/30/20 at 9:00 AM EST by a video enabled telemedicine application and verified that I am speaking with the correct person using two identifiers.   Location: Patient: Patient Home Provider: [facility]   Case Manager discussed the limitations of evaluation and management by telemedicine and the availability of in person appointments during orientation. The patient expressed understanding and agreed to proceed.   History of Present Illness: MDD, GAD    Observations/Objective: Case Manager checked in with all participants to review discharge dates, insurance authorizations, work-related documents and needs for the treatment team. Counselor facilitated a check-in with group members to gauge mood and current functioning as well as identify recent progress towards treatment goals.  Susan [name], who prefers to go by "Susan [name]" presented to group session on time and was alert, oriented x5, with no evidence or self-report of SI/HI or A/V H.  Susan [name] reported scores of 0/10 for depression and 5/10 for anxiety today.  Susan [name] reported that she was able to get court-related business handled yesterday, which helped in alleviating some stress.  She reported that she will need to visit campus again soon at school, but has found she can cope with this better by bringing a friend she can speak with for support and distraction.  Susan [name] reported that she plans to spend time on homework over the weekend to keep her grades up, but may outreach her friend group about planning a spontaneous hangout together.     Counselor engaged the group in discussion on managing work/life balance today to improve mental health and wellness.  Counselor explained how finding balance between responsibilities at home and work place can be challenging, lead to increased stress, and this has been further complicated by recent pandemic leading to  unemployment, more virtual work, and blurring of lines between home as a place of rest or work duties.  Counselor facilitated discussion on what challenges members have faced with this issue historically, as well as what, if any, issues have arisen following pandemic.  Counselor also discussed strategies for improving work/life balance while members work on their mental health during treatment.  Some of these included keeping track of time management; creating a list of priorities and scaling importance; setting realistic, measurable goals each day; establishing boundaries; taking care of health needs; and nurturing relationships at home and work for support.  Counselor inquired about areas where members feel they are excelling, as well as areas they could focus on during treatment. Intervention effectiveness could not be measured, as Susan [name] did not respond to questioning from counselor when this material was being presented, and logged out of session shortly thereafter, and did not return.  Counselor informed case manager of incomplete session.      Psycho-educational portion of group was provided by Virgina Evener, Interior and spatial designer of community education with [employer].  Alexandra provided information on history of her local agency, mission statement, and the variety of unique services offered which group members might find beneficial to engage in, including both virtual and in-person support groups, as well as peer support program for mentoring.  Alexandra offered time to answer member's questions regarding services and encouraged them to consider utilizing these services to assist in working towards their individual wellness goals.  Due to Susan [name]'s departure from group at around 11am, Susan [name] was not present for this intervention either.     Assessment and Plan: Counselor recommends that  patient remains in IOP treatment to better manage mental health symptoms and continue to address treatment plan  goals. Counselor recommends adherence to crisis/safety plan, taking medications as prescribed and following up with medical professionals if any issues arise.    Follow Up Instructions: Counselor will send Webex link for next session.  The patient was advised to call back or seek an in-person evaluation if the symptoms worsen or if the condition fails to improve as anticipated.   I provided 120 minutes of non-face-to-face time during this encounter.     Shade Flood, LCSW, LCAS

## 2020-02-02 ENCOUNTER — Other Ambulatory Visit (HOSPITAL_COMMUNITY): Payer: 59 | Admitting: Licensed Clinical Social Worker

## 2020-02-02 ENCOUNTER — Other Ambulatory Visit: Payer: Self-pay

## 2020-02-02 DIAGNOSIS — F429 Obsessive-compulsive disorder, unspecified: Secondary | ICD-10-CM | POA: Diagnosis not present

## 2020-02-02 DIAGNOSIS — F419 Anxiety disorder, unspecified: Secondary | ICD-10-CM | POA: Diagnosis not present

## 2020-02-02 DIAGNOSIS — Z79899 Other long term (current) drug therapy: Secondary | ICD-10-CM | POA: Diagnosis not present

## 2020-02-02 DIAGNOSIS — Z880 Allergy status to penicillin: Secondary | ICD-10-CM | POA: Diagnosis not present

## 2020-02-02 DIAGNOSIS — F329 Major depressive disorder, single episode, unspecified: Secondary | ICD-10-CM | POA: Diagnosis not present

## 2020-02-02 DIAGNOSIS — F332 Major depressive disorder, recurrent severe without psychotic features: Secondary | ICD-10-CM

## 2020-02-02 DIAGNOSIS — F411 Generalized anxiety disorder: Secondary | ICD-10-CM

## 2020-02-02 MED FILL — lamoTRIgine 100 MG TABS: 100 | 30 days supply | Qty: 90 | Fill #3

## 2020-02-02 NOTE — Progress Notes (Signed)
Virtual Visit via Video Note   I connected with Susan [name], who prefers to go by "Susan [name]" on 02/02/20 at 9:00 AM EST by a video enabled telemedicine application and verified that I am speaking with the correct person using two identifiers.   Location: Patient: Patient Home Provider: [facility] Office   Case Manager discussed the limitations of evaluation and management by telemedicine and the availability of in person appointments during orientation. The patient expressed understanding and agreed to proceed.   History of Present Illness: MDD, GAD    Observations/Objective: Case Manager checked in with all participants to review discharge dates, insurance authorizations, work-related documents and needs for the treatment team. Counselor facilitated a check-in with group members to gauge mood and current functioning as well as identify recent progress towards treatment goals.  Susan [name], who prefers to go by "Susan [name]" presented to group session on time and was alert, oriented x5, with no evidence or self-report of SI/HI or A/V H.  Susan [name] reported scores of 4/10 for both depression and anxiety today.  Susan [name] reported that her weekend was not very eventful due to car problems she is having at the moment limiting her from visiting friends. She reported that her plan this week is to engage in self-care activities she can do from comfort of her home such as playing with her dog, watching movies, and going outside when the weather is nice.  She also reported focusing on assignment completion for school each day to maintain grades and productivity.   Counselor provided psychoeducation on personal boundaries today.  Counselor provided worksheet to members on this subject which explained how boundaries can be considered limits and rules we set for ourselves in relationships, and involves the ability to say "No" to unreasonable demands. Suggestions for improving personal boundaries were discussed, including  how to use confident body language, being respectful to others, planning ahead regarding how to approach difficult conversations, and learning to reach a compromise when possible.  Members were encouraged to consider which traits they most often display, and discussion centered upon how this impacts interactions with supports, and whether changes could be made to improve relationships.  Members were also given opportunities to practice responses they could provide when faced with difficult situations (i.e. being asked to do something at a late hour when inconvenient, having a nosy coworker ask personal questions about work leave, etc).  Interventions were effective, as evidenced by Susan [name] reporting that she has learned a lot about boundaries due to interactions with her family, as she "Grew up in a close-minded household where I couldn't have a real personality".  Susan [name] reported that due to her parents' views, she did not feel like she could speak her own mind or have an opinion on things, but once she left her childhood home and got older, she began to assert herself more, eventually voicing concerns for balance in these relationships.  Susan [name] stated "We don't have to agree on everything, but we can at least be respectful".  Susan [name] reported that her mother has been more accepting since then, but her father can still be pushy at times with his own biases, so she has learned appropriate responses to "Shut him down" in a respectful way and avoid influence on her own mental health.  She participated successfully in providing responses to various roleplay scenarios where she needed to turn down requests today in session to embody healthier boundaries  Counselor ended session by acknowledging a graduating group member by prompting graduating member to  reflect on progress made, takeaways from treatment and plan for stepping down. Counselor and group members shared observations of growth, encouragement and support as she  transitions out of the program.    Follow Up Instructions: Counselor will send Webex link for next session.  The patient was advised to call back or seek an in-person evaluation if the symptoms worsen or if the condition fails to improve as anticipated.   I provided 180 minutes of non-face-to-face time during this encounter.     Noralee Stain, LCSW, LCAS

## 2020-02-03 ENCOUNTER — Other Ambulatory Visit: Payer: Self-pay

## 2020-02-03 ENCOUNTER — Other Ambulatory Visit (HOSPITAL_COMMUNITY): Payer: 59

## 2020-02-03 NOTE — Psych (Signed)
Virtual Visit via Video Note  I connected with Susan [name] on 01/15/20 at  9:00 AM EDT by a video enabled telemedicine application and verified that I am speaking with the correct person using two identifiers.   I discussed the limitations of evaluation and management by telemedicine and the availability of in person appointments. The patient expressed understanding and agreed to proceed.  I discussed the assessment and treatment plan with the patient. The patient was provided an opportunity to ask questions and all were answered. The patient agreed with the plan and demonstrated an understanding of the instructions.   The patient was advised to call back or seek an in-person evaluation if the symptoms worsen or if the condition fails to improve as anticipated.  Pt was provided 240 minutes of non-face-to-face time during this encounter.   [name], LCSW    [facility] PHP THERAPIST PROGRESS NOTE  Kortnie Stovall [id number]  Session Time: 9:00 - 10:00  Participation Level: Active  Behavioral Response: CasualAlertDepressed  Type of Therapy: Group Therapy  Treatment Goals addressed: Coping  Interventions: CBT, DBT, Supportive and Reframing  Summary: Clinician led check-in regarding current stressors and situation, and review of patient completed daily inventory. Clinician utilized active listening and empathetic response and validated patient emotions. Clinician facilitated processing group on pertinent issues.   Therapist Response:Susan [name] is a 19 y.o. female who presents with depression and anxiety symptoms. Patient arrived within time allowed and reports that she is feeling "not well rested" Patient rates hermood at [facility] a scale of 1-10 with 10 being great. Pt reportsschool is becoming more stressful and she is feeling pressure to catch up quickly. Pt struggles with her sleep. Pt is able to process. Patient engaged in  discussion.       Session Time: 10:00-11:00  Participation Level:Minimal  Behavioral Response:CasualAlertDepressed  Type of Therapy:Group Therapy  Treatment Goals addressed: Coping  Interventions:CBT, DBT, Solution Focused, Supportive and Reframing  Summary:Cln led discussion on "retail therapy." Group members shared struggles with buying things when not feeling well and the impact it may have. Cln discussed shopping as a coping skill and synthesizes topics of impulse control, reframing perception, and distress tolerance. Group members brainstormed tips and tricks to manage their shopping habits and ways to extend grace to themselves about this coping skill.   Therapist Response: Pt minimally engaged in discussion.      Session Time: 11:00- 12:00  Participation Level:Minimal  Behavioral Response:CasualAlertDepressed  Type of Therapy:Group Therapy  Treatment Goals addressed: Coping  Interventions:CBT, DBT, Solution Focused, Supportive and Reframing  Summary:Cln continued topic of cognitive distortions. Group reviewed examples of distortions and shared how they have "caught" their own distortions. Cln introduced the "challenge" component of behavior modification model "catch-challenge-change." Cln utilized handout "Socratic Questions" to aid discussion of how to bring logic into distorted thinking.   Therapist Response:  Pt minimally engaged in discussion and reports understanding of how to challenge distorted thinking.       Session Time: 12:00 -1:00  Participation Level:Minimal  Behavioral Response:CasualAlertDepressed  Type of Therapy:Group therapy  Treatment Goals addressed: Coping  Interventions:CBT; Solution focused; Supportive; Reframing  Summary:12:00 - 12:50:Cln continued topic of cognitive distortions and led "challenge" practice. Group members provided examples of distorted thoughts and group worked  together to challenge them and reframe perspective.  12:50 -1:00 Clinician led check-out. Clinician assessed for immediate needs, medication compliance and efficacy, and safety concerns  Therapist Response:12:00 - 12:50: Pt minimally engaged in activity and discussion.  12:50 - 1:00: At check-out, patientrates hermood at a6.5on a scale of 1-10 with 10 being great.Pt states afternoon plans ofrunning errands and attending class.Patient demonstrates limitedprogress as evidenced by attending group.Patient denies SI/HI/self-harm at the end of group.    Suicidal/Homicidal: Nowithout intent/plan  Plan: Pt will continue in PHP while working to increase stability post-discharge from inpatient psychiatric admission, decrease depression and anxiety symptoms, and increase ability to manage symptoms in a healthy manner when they arise.   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 Severe recurrent major depressive disorder with psychotic features (HCC)   2. Generalized anxiety disorder       Donia Guiles, LCSW 02/03/2020

## 2020-02-04 ENCOUNTER — Other Ambulatory Visit: Payer: Self-pay

## 2020-02-04 ENCOUNTER — Other Ambulatory Visit (HOSPITAL_COMMUNITY): Payer: 59 | Admitting: Licensed Clinical Social Worker

## 2020-02-04 DIAGNOSIS — F411 Generalized anxiety disorder: Secondary | ICD-10-CM

## 2020-02-04 DIAGNOSIS — F332 Major depressive disorder, recurrent severe without psychotic features: Secondary | ICD-10-CM

## 2020-02-04 DIAGNOSIS — F329 Major depressive disorder, single episode, unspecified: Secondary | ICD-10-CM | POA: Diagnosis not present

## 2020-02-04 DIAGNOSIS — F419 Anxiety disorder, unspecified: Secondary | ICD-10-CM | POA: Diagnosis not present

## 2020-02-04 DIAGNOSIS — Z880 Allergy status to penicillin: Secondary | ICD-10-CM | POA: Diagnosis not present

## 2020-02-04 DIAGNOSIS — Z79899 Other long term (current) drug therapy: Secondary | ICD-10-CM | POA: Diagnosis not present

## 2020-02-04 DIAGNOSIS — F429 Obsessive-compulsive disorder, unspecified: Secondary | ICD-10-CM | POA: Diagnosis not present

## 2020-02-04 NOTE — Progress Notes (Signed)
Virtual Visit via Video Note   I connected with Susan [name], who prefers to go by "Susan [name]" on 02/04/20 at 9:00 AM EST by a video enabled telemedicine application and verified that I am speaking with the correct person using two identifiers.   Location: Patient: Patient Home Provider: [facility] [place] Office   Case Manager discussed the limitations of evaluation and management by telemedicine and the availability of in person appointments during orientation. The patient expressed understanding and agreed to proceed.   History of Present Illness: MDD, GAD    Observations/Objective: Case Manager checked in with all participants to review discharge dates, insurance authorizations, work-related documents and needs for the treatment team. Counselor facilitated a check-in with group members to gauge mood and current functioning as well as identify recent progress towards treatment goals.  Mahsa, who prefers to go by "Susan [name]" presented to group session on time and was alert, oriented x5, with no evidence or self-report of SI/HI or A/V H.  Susan [name] reported scores of 2.5/10 for depression and 3/10 for anxiety today.  Susan [name] reported that yesterday did not go well for her, as she dropped her laptop, has been having car trouble, and when her dog got scared and jumped on her, she broke down under the weight of these stressors and cried.  Susan [name] reported that she has been trying to go to the gym for stress relief during treatment, and will plan to borrow car today from her mother to get some cardio in if possible.       Counselor introduced topic of stress management today.  Counselor provided definition of stress as feeling tense, overwhelmed, worn out, and/or exhausted, and noted that in small amounts, stress can be motivating until things become too overwhelming to manage.  Counselor also explained how stress can be acute (brief but intense) or chronic (long-lasting) and this can impact the severity of symptoms one  can experience in the physical, emotional, and behavioral categories.  Counselor inquired about members' specific stressors, how long they have been prevalent, and the various symptoms that tend to manifest as a result.  Counselor also explained that research has shown a strong support network composed of trusted family, friends, or community members can increase resilience in times of stress, and inquired about who members can reach out to for help in managing stressors.  Counselor encouraged members to consider discussing stressor 'red flags' with their close supports that can be monitored and strategies for assisting them in times of crisis.  Susan [name] showed limited engagement during this portion of group, and did not respond to counselor's questions on two occasions.  Susan [name] reported that one of her chronic stressors is interacting with her family due to the unhealthy boundaries that her father has, which typically leads them to arguments.  Susan [name] reported that this makes her tense around him and is one reason she limits contact now that she is older.  Susan [name] reported that it can also be stressful at times to keep up with weekly tasks, as she has historically kept a 'mental to-do list' in mind, but admitted that this can be unreliable at times, so keeping one on her phone could be an effective strategy for staying ahead of upcoming responsibilities and reduce related stress.      Counselor ended session by acknowledging a graduating group member by prompting graduating member to reflect on progress made, takeaways from treatment and plan for stepping down. Counselor and group members shared observations of growth, encouragement and support as she  transitions out of the program. Susan [name] did not participate in this portion of group.    Assessment and Plan: Counselor recommends that patient remains in IOP treatment to better manage mental health symptoms and continue to address treatment plan goals. Counselor  recommends adherence to crisis/safety plan, taking medications as prescribed and following up with medical professionals if any issues arise.    Follow Up Instructions: Counselor will send Webex link for next session.  The patient was advised to call back or seek an in-person evaluation if the symptoms worsen or if the condition fails to improve as anticipated.   I provided 180 minutes of non-face-to-face time during this encounter.     Noralee Stain, LCSW, LCAS

## 2020-02-04 NOTE — Psych (Signed)
Virtual Visit via Video Note  I connected with Susan [name] on 01/20/20 at  9:00 AM EDT by a video enabled telemedicine application and verified that I am speaking with the correct person using two identifiers.   I discussed the limitations of evaluation and management by telemedicine and the availability of in person appointments. The patient expressed understanding and agreed to proceed.  I discussed the assessment and treatment plan with the patient. The patient was provided an opportunity to ask questions and all were answered. The patient agreed with the plan and demonstrated an understanding of the instructions.   The patient was advised to call back or seek an in-person evaluation if the symptoms worsen or if the condition fails to improve as anticipated.  Pt was provided 240 minutes of non-face-to-face time during this encounter.   Donia Guiles, LCSW    [facility] PHP THERAPIST PROGRESS NOTE  Susan [name] [id number]  Session Time: 9:00 - 10:00  Participation Level: Active  Behavioral Response: CasualAlertDepressed  Type of Therapy: Group Therapy  Treatment Goals addressed: Coping  Interventions: CBT, DBT, Supportive and Reframing  Summary: Clinician led check-in regarding current stressors and situation, and review of patient completed daily inventory. Clinician utilized active listening and empathetic response and validated patient emotions. Clinician facilitated processing group on pertinent issues.   Therapist Response:Susan [name] is a 19 y.o. female who presents with depression and anxiety symptoms. Patient arrived within time allowed and reports that she is feeling "neutral" Patient rates hermood at [facility] a scale of 1-10 with 10 being great. Pt reportsshe had a "really good day" yesterday and spent time with her friend and mom. Pt reports mood was stable. Pt struggles with opening up. Pt is able to process. Patient engaged in  discussion.         Session Time: 10:00-11:00  Participation Level:Minimal  Behavioral Response:CasualAlertDepressed  Type of Therapy: Group Therapy  Treatment Goals addressed: Coping  Interventions:CBT, DBT, Solution Focused, Supportive and Reframing  Summary:Cln led discussion on trust in relationships. Cln discussed the need for time and measured build-up to establish trust and deeper relationships. Group members shared ways in which this is a struggle for them and issues they have had with expectations in relationships.   Therapist Response: Pt minimally engaged in discussion.      Session Time: 11:00- 12:00  Participation Level:Active  Behavioral Response:CasualAlertDepressed  Type of Therapy: Group Therapy  Treatment Goals addressed: Coping  Interventions:CBT, DBT, Solution Focused, Supportive and Reframing  Summary:Cln continued topic of boundaries. Cln discussed the different boundary types: rigid, porous, and healthy. Group discussed the ways each boundary type can be helpful or unhelpful in various situations. Group members shared their default boundary type and how it has been helpful or unhelpful for them.   Therapist Response:  Pt was non-responsive during discussion.         Session Time: 12:00 -1:00  Participation Level:Active  Behavioral Response:CasualAlertDepressed  Type of Therapy: Group therapy  Treatment Goals addressed: Coping  Interventions:CBT; Solution focused; Supportive; Reframing  Summary:12:00 - 12:50:Cln continued topic of boundaries. Cln introduced the different categories of boundaries: physical, emotional, intellectual, sexual, material, and time.  boundaries types.  Group discussed struggles they experience with each category.   12:50 -1:00 Clinician led check-out. Clinician assessed for immediate needs, medication compliance and efficacy, and safety concerns  Therapist  Response:12:00 - 12:50: Pt was non-responsive during discussion.   12:50 - 1:00: At check-out, patientrates hermood at [facility] a scale of  1-10 with 10 being great.Pt states afternoon plans ofrunning errands.Patient demonstrates limitedprogress as evidenced by reporting stable mood however continues to not fully engage in group.Patient denies SI/HI/self-harm at the end of group.    Suicidal/Homicidal: Nowithout intent/plan  Plan: Pt will continue in PHP while working to increase stability post-discharge from inpatient psychiatric admission, decrease depression and anxiety symptoms, and increase ability to manage symptoms in a healthy manner when they arise.   Diagnosis: Severe recurrent major depressive disorder with psychotic features ([place]) [F33.3]    1. Severe recurrent major depressive disorder with psychotic features ([place])   2. Difficulty coping   3. Generalized anxiety disorder       [name], LCSW 02/04/2020

## 2020-02-04 NOTE — Psych (Signed)
Virtual Visit via Video Note  I connected with Susan [name] on 01/19/20 at  9:00 AM EDT by a video enabled telemedicine application and verified that I am speaking with the correct person using two identifiers.   I discussed the limitations of evaluation and management by telemedicine and the availability of in person appointments. The patient expressed understanding and agreed to proceed.  I discussed the assessment and treatment plan with the patient. The patient was provided an opportunity to ask questions and all were answered. The patient agreed with the plan and demonstrated an understanding of the instructions.   The patient was advised to call back or seek an in-person evaluation if the symptoms worsen or if the condition fails to improve as anticipated.  Pt was provided 240 minutes of non-face-to-face time during this encounter.   Susan Glass, LCSW    [facility] PHP THERAPIST PROGRESS NOTE  Susan [name] [id number]  Session Time: 9:00 - 10:00  Participation Level: Active  Behavioral Response: CasualAlertDepressed  Type of Therapy: Group Therapy  Treatment Goals addressed: Coping  Interventions: CBT, DBT, Supportive and Reframing  Summary: Clinician led check-in regarding current stressors and situation, and review of patient completed daily inventory. Clinician utilized active listening and empathetic response and validated patient emotions. Clinician facilitated processing group on pertinent issues.   Therapist Response:Susan [name] is a 19 y.o. female who presents with depression and anxiety symptoms. Patient arrived within time allowed and reports that she is feeling "okay" Patient rates hermood at [facility] a scale of 1-10 with 10 being great. Pt reportsyesterday was "not a good day" and she struggled with judgment of her feelings. Pt reports feeling optimistic about the week because it is her spring break.  Pt struggles with her sleep. Pt is  able to process. Patient engaged in discussion.        Session Time: 10:00-11:00  Participation Level:Minimal  Behavioral Response:CasualAlertDepressed  Type of Therapy:Group Therapy  Treatment Goals addressed: Coping  Interventions:CBT, DBT, Solution Focused, Supportive and Reframing  Summary:Cln led discussion on shame and the way it affects [place]. Group members were given space to process their struggles with shame. Cln encouraged pt's to apply cognitive distortions and thought challenging to feelings of shame.   Therapist Response:  Pt minimally engaged in discussion and accepted and received support from other group members.       Session Time: 11:00- 12:00  Participation Level:Active  Behavioral Response:CasualAlertDepressed  Type of Therapy: Group Therapy, OT  Treatment Goals addressed: Coping  Interventions:Psychosocial skills training, Supportive  Summary:Occupational Therapy group  Therapist Response:Patient engaged in group. See OT note.       Session Time: 12:00 -1:00  Participation Level:Minimal  Behavioral Response:CasualAlertDepressed  Type of Therapy:Group therapy  Treatment Goals addressed: Coping  Interventions:CBT; Solution focused; Supportive; Reframing  Summary:12:00 - 12:50: Cln introduced topic of boundaries. Cln and group members discussed how they think of boundaries and how learning and applying healthy boundaries can be helpful in recovery. Cln read 12 examples of boundary issues and pt's discussed which examples they could apply to themselves and struggles they have with those concepts.  12:50 -1:00 Clinician led check-out. Clinician assessed for immediate needs, medication compliance and efficacy, and safety concerns  Therapist Response:12:00 - 12:50:Pt engaged in discussion. Pt identifies having poor boundaries and reports desire to strengthen the boundaries they have.    12:50 - 1:00: At check-out, patientrates hermood at a8.5on a scale of 1-10 with 10 being great.Pt states afternoon plans  ofseeing a friend.Patient demonstrates limitedprogress as evidenced by attending group however remains guarded in participation.Patient denies SI/HI/self-harm at the end of group.    Suicidal/Homicidal: Nowithout intent/plan  Plan: Pt will continue in PHP while working to increase stability post-discharge from inpatient psychiatric admission, decrease depression and anxiety symptoms, and increase ability to manage symptoms in a healthy manner when they arise.   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 Severe recurrent major depressive disorder with psychotic features (HCC)   2. Generalized anxiety disorder       Donia Guiles, LCSW 02/04/2020

## 2020-02-05 ENCOUNTER — Other Ambulatory Visit: Payer: Self-pay

## 2020-02-05 ENCOUNTER — Other Ambulatory Visit (HOSPITAL_COMMUNITY): Payer: 59 | Admitting: Licensed Clinical Social Worker

## 2020-02-05 DIAGNOSIS — Z880 Allergy status to penicillin: Secondary | ICD-10-CM | POA: Diagnosis not present

## 2020-02-05 DIAGNOSIS — F411 Generalized anxiety disorder: Secondary | ICD-10-CM

## 2020-02-05 DIAGNOSIS — F332 Major depressive disorder, recurrent severe without psychotic features: Secondary | ICD-10-CM

## 2020-02-05 DIAGNOSIS — F329 Major depressive disorder, single episode, unspecified: Secondary | ICD-10-CM | POA: Diagnosis not present

## 2020-02-05 DIAGNOSIS — R4589 Other symptoms and signs involving emotional state: Secondary | ICD-10-CM

## 2020-02-05 DIAGNOSIS — F419 Anxiety disorder, unspecified: Secondary | ICD-10-CM | POA: Diagnosis not present

## 2020-02-05 DIAGNOSIS — F429 Obsessive-compulsive disorder, unspecified: Secondary | ICD-10-CM | POA: Diagnosis not present

## 2020-02-05 DIAGNOSIS — Z79899 Other long term (current) drug therapy: Secondary | ICD-10-CM | POA: Diagnosis not present

## 2020-02-05 NOTE — Psych (Signed)
Virtual Visit via Video Note  I connected with Susan [name] on 01/21/20 at  9:00 AM EDT by a video enabled telemedicine application and verified that I am speaking with the correct person using two identifiers.   I discussed the limitations of evaluation and management by telemedicine and the availability of in person appointments. The patient expressed understanding and agreed to proceed.  I discussed the assessment and treatment plan with the patient. The patient was provided an opportunity to ask questions and all were answered. The patient agreed with the plan and demonstrated an understanding of the instructions.   The patient was advised to call back or seek an in-person evaluation if the symptoms worsen or if the condition fails to improve as anticipated.  Pt was provided 240 minutes of non-face-to-face time during this encounter.   Susan Guiles, LCSW    [facility] PHP THERAPIST PROGRESS NOTE  Susan [name] [id number]  Session Time: 9:00 - 10:00  Participation Level: Active  Behavioral Response: CasualAlertDepressed  Type of Therapy: Group Therapy  Treatment Goals addressed: Coping  Interventions: CBT, DBT, Supportive and Reframing  Summary: Clinician led check-in regarding current stressors and situation, and review of patient completed daily inventory. Clinician utilized active listening and empathetic response and validated patient emotions. Clinician facilitated processing group on pertinent issues.   Therapist Response:Susan [name] is a 19 y.o. female who presents with depression and anxiety symptoms. Patient arrived within time allowed and reports that she is feeling "mediocre" Patient rates hermood at [facility] a scale of 1-10 with 10 being great. Pt reports spent the day with her mom and a friend and it went well. Pt reports she is "mostly done" with her school work that she is catching up on. Pt struggled with sleep. Pt is able to process.  Patient engaged in discussion.         Session Time: 10:00 -11:00  Participation Level:Active  Behavioral Response:CasualAlertDepressed  Type of Therapy: Group Therapy, psychoeducation, psychotherapy  Treatment Goals addressed: Coping  Interventions:CBT, DBT, Solution Focused, Supportive and Reframing  Summary:Cln led discussion on insecurity. Group members discussed struggles with insecurity and the ways in which it presents and affects them. Cln discussed positive affirmations as a way to manage insecurity. Group members brainstormed positive affirmations to use and coach themselves on.   Therapist Response: Pt engaged in discussion and reports struggling with insecure thoughts specifically around self-worth. Pt reports willingness to use positive affirmations to remind them of their value.       Session Time: 11:00 -12:00  Participation Level:Active  Behavioral Response:CasualAlertDepressed  Type of Therapy: Group Therapy, psychotherapy  Treatment Goals addressed: Coping  Interventions:Strengths based, reframing, Supportive,   Summary:Spiritual Care group  Therapist Response: Patient engaged in group. See chaplain note.        Session Time: 12:00- 1:00  Participation Level:Active  Behavioral Response:CasualAlertDepressed  Type of Therapy: Group Therapy, OT  Treatment Goals addressed: Coping  Interventions:Psychosocial skills training, Supportive  Summary:12:00 - 12:50 Occupational Therapy group 12:50 -1:00 Clinician led check-out. Clinician assessed for immediate needs, medication compliance and efficacy, and safety concerns  Therapist Response:Patient engaged in group. See OT note.  12:50 - 1:00: At check-out, patientrates hermood at [facility] a scale of 1-10 with 10 being great.Pt states afternoon plans ofnapping.Patient demonstrates someprogress as evidenced by increased engagement in  conversation.Patient denies SI/HI/self-harm at the end of group.    Suicidal/Homicidal: Nowithout intent/plan  Plan: Pt will continue in PHP while working to increase stability post-discharge  from inpatient psychiatric admission, decrease depression and anxiety symptoms, and increase ability to manage symptoms in a healthy manner when they arise.   Diagnosis: Severe recurrent major depressive disorder with psychotic features ([place]) [F33.3]    1. Severe recurrent major depressive disorder with psychotic features ([place])   2. Generalized anxiety disorder       Susan Glass, LCSW 02/05/2020

## 2020-02-05 NOTE — Progress Notes (Signed)
Virtual Visit via Video Note  I connected with [name] on 02/05/20 at  9:00 AM EDT by a video enabled telemedicine application and verified that I am speaking with the correct person using two identifiers.   Case Manager discussed the limitations of evaluation and management by telemedicine and the availability of in person appointments. The patient expressed understanding and agreed to proceed.  Location:  Patient: Patient Home Provider: [facility] OPT Office  History of Present Illness: MDD, GAD  Observations/Objective: Case Manager checked in with all participants to review discharge dates, insurance authorizations, work-related documents and needs for the treatment team. Clinician facilitated a check-in with group members to assess mood and current functioning. Clinician introduced self and prompted client to provide an update on functioning since last group session. Client checked in by providing an update and reported means of handling anxiety such as exercising. Client identified that she has experiencing car difficulties however she is thankful for her mother allowing her to use her vehicle. Client denied any current SI/HI/psychosis.   Initial group discussion was provided by [facility], [place]. Chaplain introduced self her role with joining the IOP group 1x weekly. Chaplain provided education on various types of grief such as anticipatory and disenfranchised grief. Chaplain provided information related to stages of grief and allowed space for members to share their personal experiences with grief/loss. Psycho-educational session co-facilitated by [facility] with yoga as a practice for mindfulness. Clinician led breathing and movement exercise to practice remaining in the moment. Client described the exercise as calming and reported "I was able to clear my mind". Clinician assessed for 1 self-care activity prior to tomorrow's group. Client reported she plans to engage in  self-care by taking her dog to the dog park.  Assessment and Plan: Clinician recommends that patient remains in IOP treatment to better manage mental health symptoms and continue to address treatment plan goals. Clinician recommends adherence to crisis/safety plan, taking medications as prescribed, and following up with medical professionals if any issues arise.    The patient was advised to call back or seek an in-person evaluation if the symptoms worsen or if the condition fails to improve as anticipated.  I provided 180 minutes of non-face-to-face time during this encounter.   Francine Graven, LCSW

## 2020-02-06 ENCOUNTER — Other Ambulatory Visit: Payer: Self-pay

## 2020-02-06 ENCOUNTER — Other Ambulatory Visit (HOSPITAL_COMMUNITY): Payer: 59 | Admitting: Licensed Clinical Social Worker

## 2020-02-06 DIAGNOSIS — F332 Major depressive disorder, recurrent severe without psychotic features: Secondary | ICD-10-CM

## 2020-02-06 DIAGNOSIS — F419 Anxiety disorder, unspecified: Secondary | ICD-10-CM | POA: Diagnosis not present

## 2020-02-06 DIAGNOSIS — F329 Major depressive disorder, single episode, unspecified: Secondary | ICD-10-CM | POA: Diagnosis not present

## 2020-02-06 DIAGNOSIS — F411 Generalized anxiety disorder: Secondary | ICD-10-CM

## 2020-02-06 DIAGNOSIS — Z880 Allergy status to penicillin: Secondary | ICD-10-CM | POA: Diagnosis not present

## 2020-02-06 DIAGNOSIS — Z79899 Other long term (current) drug therapy: Secondary | ICD-10-CM | POA: Diagnosis not present

## 2020-02-06 DIAGNOSIS — F429 Obsessive-compulsive disorder, unspecified: Secondary | ICD-10-CM | POA: Diagnosis not present

## 2020-02-06 NOTE — Progress Notes (Signed)
Virtual Visit via Video Note   I connected with Susan [name], who prefers to go by "Susan [name]" on 02/06/20 at 9:00 AM EST by a video enabled telemedicine application and verified that I am speaking with the correct person using two identifiers.   Location: Patient: Patient Home Provider: OPT [facility] Office   Case Manager discussed the limitations of evaluation and management by telemedicine and the availability of in person appointments during orientation. The patient expressed understanding and agreed to proceed.   History of Present Illness: MDD, GAD    Observations/Objective: Case Manager checked in with all participants to review discharge dates, insurance authorizations, work-related documents and needs for the treatment team. Counselor facilitated a check-in with group members to gauge mood and current functioning as well as identify recent progress towards treatment goals.  Susan [name], who prefers to go by "Susan [name]" presented to group session on time and was alert, oriented x5, with no evidence or self-report of SI/HI or A/V H.  Susan [name] reported scores of 2/10 for depression and 3/10 for anxiety today.  Susan [name] reported that she was doing well today, and spent yesterday taking her dog to the doctor, visiting the gym, and speaking with her mother about options for college.  Susan [name] reported that the thought of entering college and choosing a major makes her somewhat anxious since she sees this as "The end of my youth", but she was receptive to feedback from group members challenging this and encouraging her to think optimistically about the future.  She reported that her plan for today would be getting a haircut and preparing for graduation pictures soon.       Counselor introduced topic of creating mental health maintenance plan today.  Counselor provided handout on subject to members, which stressed the importance of maintaining one's mental health in a similar way to using diet and exercise to ensure  physical health.  Counselor walked members through process of identifying triggers which could worsen symptoms, including specific people, places, and things one needs to avoid.  Members were also tasked with identifying warning signs such as thoughts, feelings, or behaviors which could indicate mental health is at increased risk.  Counselor also facilitated conversation on self-care activities and coping strategies which members have previously utilized in the past, are currently using in daily routine, or plan to use soon to assist with managing problems or symptoms when/if they appear.  Counselor encouraged members to revisit their maintenance plan often and make changes as needed to ensure day to day stability.  Intervention was effective, as evidenced by Susan [name] participating in activity and discussion.  Susan [name] reported that one of her biggest triggers is being labeled as 'mopey' by her parents when she is depressed.  Susan [name] reported that her parents have done this for years and felt like this interpretation of her demeanor belittled the seriousness of her mental health concerns.  Susan [name] reported that in her family the culture was that "You don't talk back to your parents", but now that she is an adult, she decided to challenge this for the first time the other day, which was empowering.  Susan [name] reported that some of her warning signs include shifts in her mood, such as increased irritability, and a tenseness in her body.  She reported that as part of her mental health maintenance plan to improve wellness during treatment, she would use coping skills such as watching TV, working out, playing piano, and exploring other healthy distractions, in addition to improving boundaries with her parents to  reduce chances of triggering behavior.        Counselor ended session by acknowledging a graduating group member by prompting graduating member to reflect on progress made, takeaways from treatment and plan for stepping  down. Counselor and group members shared observations of growth, encouragement and support as she transitions out of the program. Susan [name] did not participate in this portion of group.    Assessment and Plan: Counselor recommends that patient remains in IOP treatment to better manage mental health symptoms and continue to address treatment plan goals. Counselor recommends adherence to crisis/safety plan, taking medications as prescribed and following up with medical professionals if any issues arise.    Follow Up Instructions: Counselor will send Webex link for next session.  The patient was advised to call back or seek an in-person evaluation if the symptoms worsen or if the condition fails to improve as anticipated.   I provided 180 minutes of non-face-to-face time during this encounter.     Shade Flood, LCSW, LCAS

## 2020-02-09 ENCOUNTER — Other Ambulatory Visit: Payer: Self-pay

## 2020-02-09 ENCOUNTER — Other Ambulatory Visit (HOSPITAL_COMMUNITY): Payer: 59 | Admitting: Licensed Clinical Social Worker

## 2020-02-09 DIAGNOSIS — Z880 Allergy status to penicillin: Secondary | ICD-10-CM | POA: Diagnosis not present

## 2020-02-09 DIAGNOSIS — F329 Major depressive disorder, single episode, unspecified: Secondary | ICD-10-CM | POA: Diagnosis not present

## 2020-02-09 DIAGNOSIS — F332 Major depressive disorder, recurrent severe without psychotic features: Secondary | ICD-10-CM

## 2020-02-09 DIAGNOSIS — F411 Generalized anxiety disorder: Secondary | ICD-10-CM

## 2020-02-09 DIAGNOSIS — F419 Anxiety disorder, unspecified: Secondary | ICD-10-CM | POA: Diagnosis not present

## 2020-02-09 DIAGNOSIS — Z79899 Other long term (current) drug therapy: Secondary | ICD-10-CM | POA: Diagnosis not present

## 2020-02-09 DIAGNOSIS — F429 Obsessive-compulsive disorder, unspecified: Secondary | ICD-10-CM | POA: Diagnosis not present

## 2020-02-09 NOTE — Progress Notes (Signed)
Virtual Visit via Video Note   I connected with Susan [name], who prefers to go by "Susan [name]" on 02/09/20 at 9:00 AM EST by a video enabled telemedicine application and verified that I am speaking with the correct person using two identifiers.   Location: Patient: Patient Home Provider: Clinical Home Office   Case Manager discussed the limitations of evaluation and management by telemedicine and the availability of in person appointments during orientation. The patient expressed understanding and agreed to proceed.   History of Present Illness: MDD, GAD    Observations/Objective: Case Manager checked in with all participants to review discharge dates, insurance authorizations, work-related documents and needs for the treatment team. Counselor facilitated a check-in with group members to gauge mood and current functioning as well as identify recent progress towards treatment goals.  Susan [name], who prefers to go by "Susan [name]" presented to group session on time and was alert, oriented x5, with no evidence or self-report of SI/HI or A/V H.  Susan [name] reported scores of 3/10 for depression and 2/10 for anxiety today.  Susan [name] reported that she had a good weekend overall, and spent 6 hours on Saturday getting her hair prepared for pictures, as well as hanging out with friends later on for relaxation.  She reported that she does not have much planned this week besides maintaining workout routine to stay healthy and active.          Counselor introduced topic of anger management today.  Counselor shared a handout with members on this subject featuring a variety of coping skills, and facilitated discussion on these approaches.  Examples included raising awareness of anger triggers, practicing deep breathing, keeping an anger log to better understand episodes, using diversion activities to distract oneself for 30 minutes, taking a time out when necessary, and being mindful of warning signs tied to thoughts or  behavior.  Counselor inquired about which techniques group members have used before, what has proved to be helpful, what their unique warning signs might be, as well as what they will try out in the future to assist with de-escalation.  Intervention effectiveness could not be measured, as Susan [name] did not participate in this portion of group.  Counselor asked Susan [name] her thoughts on the subject and received no response, and Susan [name] logged out of session at 11:30am.  Counselor informed case manager of ongoing issues with engagement that Susan [name] is having in second half of these sessions.    Assessment and Plan: Counselor recommends that patient remains in IOP treatment to better manage mental health symptoms and continue to address treatment plan goals. Counselor recommends adherence to crisis/safety plan, taking medications as prescribed and following up with medical professionals if any issues arise.    Follow Up Instructions: Counselor will send Webex link for next session.  The patient was advised to call back or seek an in-person evaluation if the symptoms worsen or if the condition fails to improve as anticipated.   I provided 150 minutes of non-face-to-face time during this encounter.     Noralee Stain, LCSW, LCAS

## 2020-02-09 NOTE — Psych (Signed)
Virtual Visit via Video Note  I connected with Susan [name] on 01/22/20 at  9:00 AM EDT by a video enabled telemedicine application and verified that I am speaking with the correct person using two identifiers.   I discussed the limitations of evaluation and management by telemedicine and the availability of in person appointments. The patient expressed understanding and agreed to proceed.  I discussed the assessment and treatment plan with the patient. The patient was provided an opportunity to ask questions and all were answered. The patient agreed with the plan and demonstrated an understanding of the instructions.   The patient was advised to call back or seek an in-person evaluation if the symptoms worsen or if the condition fails to improve as anticipated.  Pt was provided 240 minutes of non-face-to-face time during this encounter.   Susan Glass, LCSW    [facility] PHP THERAPIST PROGRESS NOTE  Susan [name] [id number]  Session Time: 9:00 - 10:00  Participation Level: Active  Behavioral Response: CasualAlertDepressed  Type of Therapy: Group Therapy  Treatment Goals addressed: Coping  Interventions: CBT, DBT, Supportive and Reframing  Summary: Clinician led check-in regarding current stressors and situation, and review of patient completed daily inventory. Clinician utilized active listening and empathetic response and validated patient emotions. Clinician facilitated processing group on pertinent issues.   Therapist Response:Susan [name] is a 19 y.o. female who presents with depression and anxiety symptoms. Patient arrived within time allowed and reports that she is feeling "neutral" Patient rates hermood at [facility] a scale of 1-10 with 10 being great. Pt reports her day yesterday was "okay" and she stayed in, took a nap, and watched a new show. Pt continues to struggle with opening up in group. Pt is able to process. Patient engaged in  discussion.         Session Time: 10:00-11:00  Participation Level:Minimal  Behavioral Response:CasualAlertDepressed  Type of Therapy: Group Therapy  Treatment Goals addressed: Coping  Interventions:CBT, DBT, Solution Focused, Supportive and Reframing  Summary:Cln led discussion on mindfulness. Cln introduced the "what" and "how" skills of mindfulness and how mindfulness can aid our mental health. Cln connected mindfulness to increased ability to utilize distress tolerance skills and cognitive distortions. Group problem-solved struggles with the concept of mindfulness.   Therapist Response: Pt minimally engaged in discussion and reports understanding of mindfulness.       Session Time: 11:00- 12:00  Participation Level:Minimal  Behavioral Response:CasualAlertDepressed  Type of Therapy: Group Therapy  Treatment Goals addressed: Coping  Interventions:CBT, DBT, Solution Focused, Supportive and Reframing  Summary:Cln continued topic of boundaries. Group utilized hand-out "How to set a boundary" to discuss process of setting healthy boundaries. Group discussed how to problem solve common boundary issues.  Therapist Response: Pt minimally engaged in discussion and reports understanding of how to set a boundary.        Session Time: 12:00 -1:00  Participation Level:Minimally  Behavioral Response:CasualAlertDepressed  Type of Therapy: Group therapy  Treatment Goals addressed: Coping  Interventions:CBT; Solution focused; Supportive; Reframing  Summary:12:00 - 12:50:Cln continued topic of boundaries. Cln led group in a boundary workshop in which group members shared current boundary issues and group worked together to address issues utilizing boundary tenets discussed previously.  12:50 -1:00 Clinician led check-out. Clinician assessed for immediate needs, medication compliance and efficacy, and safety  concerns  Therapist Response:12:00 - 12:50: Pt minimally engaged in activity and discussion.  12:50 - 1:00: At check-out, patientwas non-responsive and did not complete check-out process.  Suicidal/Homicidal: Nowithout intent/plan  Plan: Pt will continue in PHP while working to increase stability post-discharge from inpatient psychiatric admission, decrease depression and anxiety symptoms, and increase ability to manage symptoms in a healthy manner when they arise.   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 Severe recurrent major depressive disorder with psychotic features (HCC)   2. Generalized anxiety disorder       Susan Guiles, LCSW 02/09/2020

## 2020-02-09 NOTE — Psych (Signed)
Virtual Visit via Video Note  I connected with Susan [name] on 01/23/20 at  9:00 AM EDT by a video enabled telemedicine application and verified that I am speaking with the correct person using two identifiers.   I discussed the limitations of evaluation and management by telemedicine and the availability of in person appointments. The patient expressed understanding and agreed to proceed.  I discussed the assessment and treatment plan with the patient. The patient was provided an opportunity to ask questions and all were answered. The patient agreed with the plan and demonstrated an understanding of the instructions.   The patient was advised to call back or seek an in-person evaluation if the symptoms worsen or if the condition fails to improve as anticipated.  Pt was provided 240 minutes of non-face-to-face time during this encounter.   [name], LCSW    [facility] PHP THERAPIST PROGRESS NOTE  Susan [name] [id number]  Session Time: 9:00 - 10:00  Participation Level: None  Behavioral Response: CasualAlertDepressed  Type of Therapy: Group Therapy  Treatment Goals addressed: Coping  Interventions: CBT, DBT, Supportive and Reframing  Summary: Clinician led check-in regarding current stressors and situation, and review of patient completed daily inventory. Clinician utilized active listening and empathetic response and validated patient emotions. Clinician facilitated processing group on pertinent issues.   Therapist Response:Susan [name] is a 19 y.o. female who presents with depression and anxiety symptoms. Patient arrived within time allowed and was non-responsive when called for check-in.          Session Time: 10:00-11:00  Participation Level: Minimal  Behavioral Response:CasualAlertDepressed  Type of Therapy: Group Therapy  Treatment Goals addressed: Coping  Interventions:CBT, DBT, Solution Focused, Supportive and  Reframing  Summary:Cln led discussion on procrastination. Cln discussed two of the possible reasons for procrastination including low motivation/fatigue secondary to depression and anxiety symptoms and anxiety/worry about dealing with an aspect of the task you are procrastinating. Cln encouraged group members to consider the underlying issue and addressing them if applicable to an instance of procrastination. Group members shared ways they connect with these two reasons as well as tasks they are procrastinating due to one of these reasons. Group members provided support and processed issues brought up.    Therapist Response: Pt minimally engaged in discussion and reports procrastination is an issue for them. Pt brainstormed with group about ways to handle their behaviors.       Session Time: 11:00- 12:00  Participation Level:Minimal  Behavioral Response:CasualAlertDepressed  Type of Therapy: Group Therapy  Treatment Goals addressed: Coping  Interventions:CBT, DBT, Solution Focused, Supportive and Reframing  Summary:Cln introduced topic of fear and how it motivated and affects [place]. Group viewed TED talk "why to define your fears instead of goals." Cln discussed how to apply decision making model presented in talk. Group utilized example to walk through the model together.  Therapist Response: Pt minimally engaged in discussion and activity. Pt reports willingness to utilize decision making model.       Session Time: 12:00 -1:00  Participation Level:Did not participate  Behavioral Response:CasualAlertDepressed  Type of Therapy: Group therapy  Treatment Goals addressed: Coping  Interventions:CBT; Solution focused; Supportive; Reframing  Summary:12:00 - 12:50: Cln introduced concept of stoicism: defining what you can and cannot control and focusing on what you can control. Group discussed frustrations and barriers with facing things they cannot  control and how practicing stoicism can help. 12:50 -1:00 Clinician led check-out. Clinician assessed for immediate needs, medication compliance and efficacy,  and safety concerns  Therapist Response:12:00 - 12:50: Pt did not participate 12:50 - 1:00: At check-out, patientwas non-responsive and did not complete check-out process.     Suicidal/Homicidal: Nowithout intent/plan  Plan: Pt will discharge from PHP due to meeting treatment goals of increased stability post-discharge from inpatient psychiatric admission, decreased depression and anxiety symptoms, and increased ability to manage symptoms in a healthy manner when they arise. Pt had limited engagement in group however reports benefit and improved symptoms. 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 Severe recurrent major depressive disorder with psychotic features (HCC)   2. Generalized anxiety disorder       Donia Guiles, LCSW 02/09/2020

## 2020-02-10 ENCOUNTER — Other Ambulatory Visit (HOSPITAL_COMMUNITY): Payer: 59

## 2020-02-10 ENCOUNTER — Other Ambulatory Visit: Payer: Self-pay

## 2020-02-11 ENCOUNTER — Encounter (HOSPITAL_COMMUNITY): Payer: Self-pay | Admitting: Family

## 2020-02-11 ENCOUNTER — Other Ambulatory Visit: Payer: Self-pay

## 2020-02-11 ENCOUNTER — Other Ambulatory Visit (HOSPITAL_COMMUNITY): Payer: 59 | Admitting: Licensed Clinical Social Worker

## 2020-02-11 DIAGNOSIS — Z79899 Other long term (current) drug therapy: Secondary | ICD-10-CM | POA: Diagnosis not present

## 2020-02-11 DIAGNOSIS — F329 Major depressive disorder, single episode, unspecified: Secondary | ICD-10-CM | POA: Diagnosis not present

## 2020-02-11 DIAGNOSIS — F419 Anxiety disorder, unspecified: Secondary | ICD-10-CM | POA: Diagnosis not present

## 2020-02-11 DIAGNOSIS — F332 Major depressive disorder, recurrent severe without psychotic features: Secondary | ICD-10-CM

## 2020-02-11 DIAGNOSIS — F429 Obsessive-compulsive disorder, unspecified: Secondary | ICD-10-CM | POA: Diagnosis not present

## 2020-02-11 DIAGNOSIS — F411 Generalized anxiety disorder: Secondary | ICD-10-CM

## 2020-02-11 DIAGNOSIS — Z880 Allergy status to penicillin: Secondary | ICD-10-CM | POA: Diagnosis not present

## 2020-02-11 NOTE — Progress Notes (Signed)
Virtual Visit via Video Note  I connected with Susan [name] on @TODAY @ at  9:00 AM EDT by a video enabled telemedicine application and verified that I am speaking with the correct person using two identifiers. I discussed the limitations of evaluation and management by telemedicine and the availability of in person appointments. The patient expressed understanding and agreed to proceed. I discussed the assessment and treatment plan with the patient. The patient was provided an opportunity to ask questions and all were answered. The patient agreed with the plan and demonstrated an understanding of the instructions.  The patient was advised to call back or seek an in-person evaluation if the symptoms worsen or if the condition fails to improve as anticipated.  I provided 20 minutes of non-face-to-face time during this encounter.   Patient ID: , female   DOB: 11/23/2000, 19 y.o.   MRN: [id number] As per previous CCA states: Pt reports to [facility] per [facility]; pt was inpt for 1 week due to SI. Pt reports 1 attempt in 8th grade by cutting wrists but stopped self, no medical attention needed. Pt denies other hospitalizations. Pt reports seeing [facility] for a few years- discharged in 2019 because pt was reporting she was "fine" due to fears therapist would share information from appointment with mother due to being a minor. Pt does not have a current therapist. Currently sees Dr. 2020 for psychiatry. Pt reports severe decrease in ADLs, including not showering for a few weeks. Pt reports stressors include school, college applications, and thoughts about the future. Pt reports [facility] gave her diagnosis of Borderline Personality Disorder. Pt shares she has passive SI daily, without intent/plan. AVH: Pt shares she has been seeing and hearing her dog around the house since the dog's death in Jun 27, 2019. Pt shares she sees someone's "energy behind them- like a fog"  for a few weeks now. Pt reports self-harm by cutting in past; most recently a month ago; denies current thoughts. Pt shares she has burned self freshman year; picks at nails until they bleed; and will slap/punch face (daily occurrence). Family history: Paternal Grandfather: bipolar and OCD; Paternal Grandmother: anxiety and OCD; Maternal Grandmother: depression. Pt reports increased isolation due to depression and COVID. Pt denies current SI with plan/intent or HI. Patients Currently Reported Symptoms/Problems: SI without plan/intent; recent self-harm acts; increased depression and anxiety; severely decreased ADLs; lacks motivation; no energy; "feels heavy, my body and my brain," can't get out of bed; lacks concentration; reports short term memory issues; feelings of hopelessness/worthlessness; no appetite currently (pt reports 30lb weight gain since March 2020 but does not have appetite currently), sleep issues (getting better with increased Seroquel); hallucinations; increased irritability; impulsiveness   Pt attended nine MH-IOP days.  Pt reports "doing good."  Reports that IOP was helpful although most days she said she was having technical difficulties.  "I enjoyed getting the feedback from others."  Denies SI/HI or A/V hallucinations.  On a 1-10 scale; with 10 being the worst; pt scored 2 on depression and 3 on anxiety. A:  D/C today.  F/U with Dr. April 2020 today at 5:45 pm and Toni Arthurs, [facility] on 03-04-20 @ 6 pm.  Encouraged support groups; especially DBT.  R:  Pt receptive.   09-12-1980, M.Ed,CNA

## 2020-02-11 NOTE — Patient Instructions (Signed)
D:  Patient completed MH-IOP today.  A:  Discharge today.  Follow up with Dr. Toni Arthurs on 02-11-20 @ 5:45 and Meredith Leeds, [facility] on 03-04-20 @ 6pm.  Encouraged support groups.  R:  Patient receptive.

## 2020-02-11 NOTE — Progress Notes (Signed)
Virtual Visit via Video Note   I connected with Susan [name], who prefers to go by "Susan [name]" on 02/11/20 at 9:18 AM EST by a video enabled telemedicine application and verified that I am speaking with the correct person using two identifiers.   Location: Patient: Patient Home Provider: [facility] Office   Case Manager discussed the limitations of evaluation and management by telemedicine and the availability of in person appointments during orientation. The patient expressed understanding and agreed to proceed.   History of Present Illness: MDD, GAD    Observations/Objective: Case Manager checked in with all participants to review discharge dates, insurance authorizations, work-related documents and needs for the treatment team. Counselor facilitated a check-in with group members to gauge mood and current functioning as well as identify recent progress towards treatment goals.  Susan [name], who prefers to go by "Susan [name]" presented to group session 18 minutes late today, but was alert, oriented x5, with no evidence or self-report of SI/HI or A/V H.  Susan [name] reported scores of 2/10 for both depression and anxiety today.  Susan [name] reported that she went to the gym yesterday and "Didn't do much else".  She reported that her plan today was to also go to the gym and then rest afterward.        Psycho-educational portion of group was provided by pharmacist, Peggye Fothergill. Pharmacist provided psychoeducation on classes of medications such as antidepressants, antipsychotics, what symptoms they are intended to treat, and any side effects one might encounter while on prescription.  Time was allowed for clients to ask any questions they might have of pharmacist.  Susan [name] did not participate in this portion of group.    Counselor provided demonstration of relaxation technique known as mindful breathing to help members increase sense of calm, resiliency, and control.  Counselor guided members through process of getting  comfortable, achieving a relaxed breathing rhythm, and focusing on this for several minutes, allowing troubling thoughts and feelings to come and go without rumination.  Counselor processed effectiveness of activity afterward in discussion with members, including how this impacted their mental state, whether it was difficult to stay focused, and if they plan to include it in self-care routine to improve day-to-day coping.  Intervention was effective, as evidenced by Susan [name] participating in activity and reporting that it was beneficial for her.  Susan [name] reported that this has become an important practice for her, as she notices that she tends to lose control over breathing first when experiencing high anxiety and/or panic.  She reported that she would try to include it in her daily routine to improve overall mood and balance.    Psycho-educational portion of group was co-facilitated by wellness director (David Stall, MS, MPH, CHES) focused on self-care in daily life. Facilitator and group members discussed presented materials regarding importance of sleep, diet, and exercise. Group members discussed any changes they are willing to make to improve an area of self-care in their lives (physical, psychological, emotional, spiritual, relationship, professional) to improve overall mental health as they continue with treatment.  Susan [name] did not participate in this portion of group.    Counselor ended session by acknowledging a graduating group member by prompting graduating member to reflect on progress made, takeaways from treatment and plan for stepping down. Counselor and group members shared observations of growth, encouragement and support as they transition out of the program. Susan [name] participated in this portion of group by stating "I'm definitely glad that I did this.  It was always great to be  able to hear from relatable people every day in IOP".  Susan [name] reported that engaging in session "Solidified my coping  mechanisms and taught me some new ones".  She reported that she did not face any challenges during IOP, and encouraged other members to avoid letting fear hold them back from sharing how they feel, and stated "Its nice to get new perspective on things from people".    Assessment and Plan: Counselor recommends that patient remains engaged in individual outpatient therapy following successful completion of IOP to continue management of mental health symptoms and progress towards treatment plan goals. Counselor recommends adherence to crisis/safety plan, taking medications as prescribed and following up with medical professionals if any issues arise.    Follow Up Instructions: The patient was advised to call back or seek an in-person evaluation if the symptoms worsen or if the condition fails to improve as anticipated.   I provided 162 minutes of non-face-to-face time during this encounter.     Shade Flood, LCSW, LCAS

## 2020-02-11 NOTE — Progress Notes (Signed)
  Virtual Visit via Telephone Note  I connected with [name] on 02/11/20 at  9:00 AM EDT by telephone and verified that I am speaking with the correct person using two identifiers.   I discussed the limitations, risks, security and privacy concerns of performing an evaluation and management service by telephone and the availability of in person appointments. I also discussed with the patient that there may be a patient responsible charge related to this service. The patient expressed understanding and agreed to proceed.  I discussed the assessment and treatment plan with the patient. The patient was provided an opportunity to ask questions and all were answered. The patient agreed with the plan and demonstrated an understanding of the instructions.   The patient was advised to call back or seek an in-person evaluation if the symptoms worsen or if the condition fails to improve as anticipated.  I provided 15 minutes of non-face-to-face time during this encounter.   Susan Rack, NP   [facility] Intensive Outpatient Program Discharge Summary  Susan [name] [id number]  Admission date: 01/27/2020 Discharge date: 02/11/2020  Reason for admission: Susan [name] is a 19 y.o. Caucasian female presents with depression and anxiety.Reportes she was recently discharge from [facility] for suicidal ideations. Reported she was experiencing auditory and visual hallicaitons on admission. Today she is denying. Reported feeling better after her discharge. Reported that she as always struggled with anxiety however has never had treatment.  Patient was enrolled in partial psychiatric program on 01/13/20.   Progress in Program Toward Treatment Goals: Ongoing, patient attended intensive outpatient programming.  It was reported that patient was reserved and guarded during daily group session. She reports overall her mood has improved since completing partial  hospitalization and intensive outpatient programing. [name] is rating her depression and anxiety 2 out of 10 with 10 being the worst.  Declining any medication refills at this time. Patient to keep all follow-up appointment.  Support, encouragement and reassurances was provided.   Progress (rationale): [name]  to keep follow-up with Dr Toni Arthurs on 02/11/2020 and Meredith Leeds, [facility] 03/04/20 @1800   Take all medications as prescribed. Keep all follow-up appointments as scheduled.  Do not consume alcohol or use illegal drugs while on prescription medications. Report any adverse effects from your medications to your primary care provider promptly.  In the event of recurrent symptoms or worsening symptoms, call 911, a crisis hotline, or go to the nearest emergency department for evaluation.   , NP 02/11/2020

## 2020-02-12 ENCOUNTER — Other Ambulatory Visit: Payer: Self-pay

## 2020-02-12 ENCOUNTER — Other Ambulatory Visit (HOSPITAL_COMMUNITY): Payer: 59

## 2020-02-13 ENCOUNTER — Ambulatory Visit (HOSPITAL_COMMUNITY): Payer: 59

## 2020-02-13 MED FILL — TRI FEMYNOR 28 TABLET: 0.18/0.215/ | 84 days supply | Qty: 84 | Fill #3

## 2020-02-23 ENCOUNTER — Ambulatory Visit (HOSPITAL_COMMUNITY): Payer: 59 | Admitting: Licensed Clinical Social Worker

## 2020-02-23 MED FILL — ALPRAZolam 0.5 MG TABS: 0.5 | 20 days supply | Qty: 60 | Fill #0

## 2020-02-23 MED FILL — QUETIAPINE FUMARATE 50 MG T: 50 | 30 days supply | Qty: 30 | Fill #1

## 2020-03-03 ENCOUNTER — Ambulatory Visit (HOSPITAL_COMMUNITY): Payer: 59 | Admitting: Psychiatry

## 2020-03-04 DIAGNOSIS — F331 Major depressive disorder, recurrent, moderate: Secondary | ICD-10-CM | POA: Diagnosis not present

## 2020-03-09 NOTE — Psych (Signed)
Virtual Visit via Video Note  I connected with Susan [name] on 01/16/20 at  9:00 AM EDT by a video enabled telemedicine application and verified that I am speaking with the correct person using two identifiers.   I discussed the limitations of evaluation and management by telemedicine and the availability of in person appointments. The patient expressed understanding and agreed to proceed.  I discussed the assessment and treatment plan with the patient. The patient was provided an opportunity to ask questions and all were answered. The patient agreed with the plan and demonstrated an understanding of the instructions.   The patient was advised to call back or seek an in-person evaluation if the symptoms worsen or if the condition fails to improve as anticipated.  Pt was provided 240 minutes of non-face-to-face time during this encounter.   Susan [name], [facility] PHP THERAPIST PROGRESS NOTE  Susan [name] [id number]  Session Time: 9:00 - 10:00  Participation Level: Active  Behavioral Response: CasualAlertDepressed  Type of Therapy: Group Therapy  Treatment Goals addressed: Coping  Interventions: CBT, DBT, Supportive and Reframing  Summary: Clinician led check-in regarding current stressors and situation, and review of patient completed daily inventory. Clinician utilized active listening and empathetic response and validated patient emotions. Clinician facilitated processing group on pertinent issues.   Therapist Response:Susan [name] is a 19 y.o. female who presents with depression and anxiety symptoms. Patient arrived within time allowed and reports that she is feeling "ok." Patient rates her mood at a 6 on a scale of 1-10 with 10 being great. Pt shares she slept poorly and feeling "draggy." Pt engaged in discussion.           Session Time: 10:00 -11:00   Participation Level: Active   Behavioral Response: CasualAlertDepressed    Type of Therapy: Group Therapy   Treatment Goals addressed: Coping   Interventions: CBT, DBT, Solution Focused, Supportive and Reframing   Summary: Cln introduced topic of grounding techniques and ways to combat procrastination.     Therapist Response: Pt engaged in discussion. Pt reports she struggles with procrastination and is working on it.       Session Time: 11:00- 12:00   Participation Level: Active   Behavioral Response: CasualAlertDepressed   Type of Therapy: Group Therapy, OT   Treatment Goals addressed: Coping   Interventions: Psychosocial skills training, Supportive   Summary: Occupational Therapy group   Therapist Response: Patient engaged in group. See OT note.            Session Time: 12:00 -1:00   Participation Level: Active   Behavioral Response: CasualAlertDepressed   Type of Therapy: Group therapy   Treatment Goals addressed: Coping   Interventions: CBT; Solution focused; Supportive; Reframing   Summary: 12:00 - 12:50: Clinician introduced topic of "Emotional First Aid". Group watched Ted-Talk. Patients discussed how physical health is valued over emotional health. Patients discussed how to build emotional resilience. 12:50 -1:00 Clinician led check-out. Clinician assessed for immediate needs, medication compliance and efficacy, and safety concerns   Therapist Response: 12:00 - 12:50: Pt engaged in discussion and reports building emotional resilience will be important for her, including preventing loneliness, building boundaries, and manage ruminations.  12:50 - 1:00: At check-out, patient rates her mood at an 8 on a scale of 1-10 with 10 being great. Pt states she does not have weekend plans yet. Patient demonstrates some progress as evidenced by increased participation in group. Patient denies SI/HI/self-harm at the end  of group.   Suicidal/Homicidal: Nowithout intent/plan  Plan: Pt will continue in PHP while working to increase stability  post-discharge from inpatient psychiatric admission, decrease depression and anxiety symptoms, and increase ability to manage symptoms in a healthy manner when they arise.   Diagnosis: Generalized anxiety disorder [F41.1]    1. Generalized anxiety disorder   2. Severe episode of recurrent major depressive disorder, without psychotic features [facility])       Susan [name], [facility] 01/16/2020

## 2020-03-10 ENCOUNTER — Other Ambulatory Visit: Payer: Self-pay

## 2020-03-10 ENCOUNTER — Other Ambulatory Visit (HOSPITAL_COMMUNITY): Payer: Self-pay | Admitting: Pediatrics

## 2020-03-10 ENCOUNTER — Ambulatory Visit (INDEPENDENT_AMBULATORY_CARE_PROVIDER_SITE_OTHER): Payer: 59 | Admitting: Pediatrics

## 2020-03-10 ENCOUNTER — Encounter (INDEPENDENT_AMBULATORY_CARE_PROVIDER_SITE_OTHER): Payer: Self-pay | Admitting: Pediatrics

## 2020-03-10 DIAGNOSIS — G40209 Localization-related (focal) (partial) symptomatic epilepsy and epileptic syndromes with complex partial seizures, not intractable, without status epilepticus: Secondary | ICD-10-CM | POA: Diagnosis not present

## 2020-03-10 MED ORDER — LAMOTRIGINE 100 MG PO TABS: ORAL_TABLET | ORAL | 3 refills | Status: DC

## 2020-03-10 NOTE — Patient Instructions (Addendum)
Thank you for coming today.  I am glad that she remains seizure-free.  We will going to continue lamotrigine without changes.  I sent her prescription for a year-long refill.  I would like to see you in a year but will be happy to see you sooner if you need it.  We talked about the imperative of getting adequate sleep and avoiding alcohol for your seizures and for other issues which we discussed today.  I refilled your prescription for lamotrigine for a year.  You are going to need to change your My Chart because you become 18 and now being an adult template.  Please ask my staff to help you.  As you enter college, I hope you have an awesome year.

## 2020-03-10 NOTE — Progress Notes (Signed)
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 female who returns for evaluation Mar 10, 2020 for the first time since Mar 10, 2019.  [name] had a virtual visit a year ago.  She has a diagnosis of focal epilepsy with impairment of consciousness.  Previous work-up is recorded in the past medical history.  Her last known seizure was January 2019.  I recorded my last note that she had a full driver's license.  She takes and tolerates lamotrigine without side effects.  We discussed that she cannot come off her medication without changing her driving status to learner's permit which would limit her independence.  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  She is totally disenchanted with school and has been content to spend the entire year with virtual instruction online.  She has been admitted to the [employer] in [place] and will major in technical aspects of theater and sound design.  She matriculates in late 06-22-23.  She was seen in the [facility] emergency department December 19, 2019 with a history of mood disorder, OCD, and anxiety.  She has been evaluated behavioral health for suicidal ideation, auditory and visual hallucinations.  She met inpatient criteria for treatment.  She has a history of self-mutilation and cutting, and thoughts of self-harm.  By history she had visual and auditory hallucinations of her dog who had died in Jun 22, 2019.  Her medical evaluation was negative except for the psychiatric examination.  Drug screen positive for THC.  She was placed on observation status at [facility] until  she could be transferred to a facility in [facility] called [place].  Since that time she has seen Dr. Launa Flight every other week, and also also seen other counselors at behavioral health.  She has participated in cognitive behavioral therapy and takes Seroquel and Effexor.  She has a problem with obesity.  She has a 28 pound weight gain since my last recorded weight February 18, 2018 with no change in height.  She tells me that she has joined [employer] and exercises 4 days a week using the rowing machine.  This will be available to her in [place].  She has completed her 2 AP courses.  She has to complete her end of course tests next week.  She works 20 hours a week at [employer] and will likely increase that time when she is out of school.  She goes to bed between 10:30 and 11 PM and sleeps soundly until 9:30 AM she has occasional arousals when she is awakened by dreams of getting ready for school when she actually is in bed.  These are not as disturbing as those she had of her dog.  She has been immunized for Covid.  Review of Systems: A complete review of systems was remarkable for patient is here to be seen for epilepsy. She reports that she has not had any seizures since her last visit. She states that she has no concerns., all other systems reviewed and negative.  Past Medical History Past Medical History:  . Anxiety   . Mood disorder ([place])   . OCD (obsessive compulsive disorder)   .  Vision abnormalities    Hospitalizations: No., Head Injury: No., Nervous System Infections: No., Immunizations up to date: Yes.    Copied from prior chart EEGNovember 12, 2018which showed a normal background with evidence of sharply contoured slow-wave activity in the left mid-temporal derivations. Findings are subtle, but I think were frequent enough that they are reliable. This suggests the presence of a left mid-temporal seizure focus, although surface recordings can be misleading.   Interictal activity is not necessarily associated with clinical seizure behavior.  MRI scan of the brain was performed on September 15, 2017, and was normal.  Birth History 8lbs. 6oz. infant born at [redacted]weeks gestational age to a 19year old g 2p 0 1 0 20fmale. Gestation wasuncomplicated Mother receivedEpidural anesthesia repeat cesarean section Nursery Course wasuncomplicated Growth and Development wasrecalled asnormal  Behavior History She is followed by a psychiatrist for anxiety, depression, and problems with insomnia. She has occasional episodes of numbness and tingling of her hands, arms, and face that happen during panic attacks. She also tells me that she has dissociative episodes which probably are brief fugue states.  There have been none recently.  Surgical History History reviewed. No pertinent surgical history.  Family History family history includes Arthritis in her maternal grandmother; Bipolar disorder in her paternal grandfather; Depression in her paternal grandfather; Heart disease in her maternal grandfather and maternal grandmother; Hypertension in her maternal uncle; Stroke in her maternal grandfather. Family history is negative for migraines, seizures, intellectual disabilities, blindness, deafness, birth defects, chromosomal disorder, or autism.  Social History Socioeconomic History  . Marital status: Single  . Years of education:  Almost 122 . Highest education level:  She is about to graduate from high school  Occupational History  .  [employer] as a barista  Tobacco Use  . Smoking status: Never Smoker  . Smokeless tobacco: Never Used  Substance and Sexual Activity  . Alcohol use: No  . Drug use: No  . Sexual activity: Never  Social History Narrative    WForensic psychologist    Lives with mother,father    2 puppies    Softball, piano.   Allergies Allergen Reactions  . Kiwi Extract Other (See Comments)    Throat burns and swells a bit  .  Latex     Mother said that her child had a reaction to latex, and only uses latex free band-aids  . Penicillins     Mother said child had a reaction to penicillin as infant. Has not been tested for allergy yet.   Physical Exam BP 130/90   Ht 5' 4.25" (1.632 m)   Wt 194 lb 6.4 oz (88.2 kg)   BMI 33.11 kg/m   General: alert, obese, well nourished, in no acute distress, dyed black, brown roots hair, blue eyes, left handed Head: normocephalic, no dysmorphic features Ears, Nose and Throat: Otoscopic: tympanic membranes normal; pharynx: oropharynx is pink without exudates or tonsillar hypertrophy Neck: supple, full range of motion, no cranial or cervical bruits Respiratory: auscultation clear Cardiovascular: no murmurs, pulses are normal Musculoskeletal: no skeletal deformities or apparent scoliosis Skin: no rashes or neurocutaneous lesions  Neurologic Exam  Mental Status: alert; oriented to person, place and year; knowledge is normal for age; language is normal Cranial Nerves: visual fields are full to double simultaneous stimuli; extraocular movements are full and conjugate; pupils are round reactive to light; funduscopic examination shows sharp disc margins with normal vessels; symmetric facial strength; midline tongue and uvula; air conduction is greater than bone  conduction bilaterally Motor: Normal strength, tone and mass; good fine motor movements; no pronator drift Sensory: intact responses to cold, vibration, proprioception and stereognosis Coordination: good finger-to-nose, rapid repetitive alternating movements and finger apposition Gait and Station: normal gait and station: patient is able to walk on heels, toes and tandem without difficulty; balance is adequate; Romberg exam is negative; Gower response is negative Reflexes: symmetric and diminished bilaterally; no clonus; bilateral flexor plantar responses  Assessment 1.  Focal epilepsy with impairment of consciousness,  G40.209  Discussion There is no reason to change her lamotrigine.  She takes and tolerates it.  Her seizures have remained under very good control.  I would like to taper or discontinue her medication when she is at college, she is not ready to do so for understandable reasons.  We knowledge the very difficult year that she has experienced.  She is hopeful that her situation has stabilized.  Plan I asked her to return to see me in a year.  I prescribed a new dose of lamotrigine with a 90-day supply and 3 refills.  There is no reason to obtain blood work at this time.  Greater than 50% of a 25-minute visit was spent in counseling and coordination of care concerning her seizures and discussing her interval history.  I am pleased that she is exercising and hope that that will stabilize her weight.  I am also hopeful that a change in schools will be beneficial for her.  I explained to her that as of July 22, 2021 I will retire and we will need to find an adult neurologist to provide long-term care for her seizures.   Medication List   Accurate as of Mar 10, 2020 11:59 PM. If you have any questions, ask your nurse or doctor.      TAKE these medications   ALPRAZolam 0.5 MG tablet Commonly known as: XANAX Take 0.5 mg by mouth 3 (three) times daily as needed. What changed: Another medication with the same name was removed. Continue taking this medication, and follow the directions you see here. Changed by: Wyline Copas, MD   lamoTRIgine 100 MG tablet Commonly known as: LAMICTAL Take 1 tablet in the morning and 2 tablets at nighttime What changed:   how much to take  how to take this  when to take this  additional instructions   QUEtiapine 25 MG tablet Commonly known as: SEROQUEL Take 1 tablet (25 mg total) by mouth at bedtime.   Tri-Previfem 0.18/0.215/0.25 MG-35 MCG tablet Generic drug: Norgestimate-Ethinyl Estradiol Triphasic Take 1 tablet daily by mouth.   venlafaxine XR  75 MG 24 hr capsule Commonly known as: EFFEXOR-XR Take 75 mg by mouth daily.    The medication list was reviewed and reconciled. All changes or newly prescribed medications were explained.  A complete medication list was provided to the patient/caregiver.  Jodi Geralds MD

## 2020-03-22 DIAGNOSIS — F331 Major depressive disorder, recurrent, moderate: Secondary | ICD-10-CM | POA: Diagnosis not present

## 2020-03-25 DIAGNOSIS — L700 Acne vulgaris: Secondary | ICD-10-CM | POA: Diagnosis not present

## 2020-03-25 MED FILL — TRETINOIN MICROSPHERE 0.04: 0.04 | 30 days supply | Qty: 50 | Fill #0

## 2020-03-25 MED FILL — DROSPIR-ETH ESTRA 3/.02 MG: 3-0.02 | 28 days supply | Qty: 28 | Fill #0

## 2020-04-05 DIAGNOSIS — F331 Major depressive disorder, recurrent, moderate: Secondary | ICD-10-CM | POA: Diagnosis not present

## 2020-04-15 DIAGNOSIS — F331 Major depressive disorder, recurrent, moderate: Secondary | ICD-10-CM | POA: Diagnosis not present

## 2020-04-23 MED FILL — VENLAFAXINE HCL ER 75 MG CA: 75 | 90 days supply | Qty: 90 | Fill #1

## 2020-04-23 MED FILL — ALPRAZolam 0.5 MG TABS: 0.5 | 20 days supply | Qty: 60 | Fill #1

## 2020-04-28 MED FILL — lamoTRIgine 100 MG TABS: 100 | 90 days supply | Qty: 270 | Fill #0

## 2020-04-28 MED FILL — QUETIAPINE FUMARATE 50 MG T: 50 | 30 days supply | Qty: 45 | Fill #2

## 2020-05-17 MED FILL — DROSPIRENONE-EE 3-0.02 MG T: 3-0.02 | 28 days supply | Qty: 28 | Fill #1

## 2020-05-24 MED FILL — QUETIAPINE FUMARATE 50 MG T: 50 | 30 days supply | Qty: 45 | Fill #3

## 2020-05-24 MED FILL — ALPRAZolam 0.5 MG TABS: 0.5 | 20 days supply | Qty: 60 | Fill #2

## 2020-06-10 MED FILL — DROSPIRENONE-EE 3-0.02 MG T: 3-0.02 | 84 days supply | Qty: 84 | Fill #2

## 2020-06-23 MED FILL — QUETIAPINE FUMARATE 50 MG T: 50 | 30 days supply | Qty: 45 | Fill #4

## 2020-06-25 DIAGNOSIS — R112 Nausea with vomiting, unspecified: Secondary | ICD-10-CM | POA: Diagnosis not present

## 2020-06-25 DIAGNOSIS — A084 Viral intestinal infection, unspecified: Secondary | ICD-10-CM | POA: Diagnosis not present

## 2020-06-25 DIAGNOSIS — R197 Diarrhea, unspecified: Secondary | ICD-10-CM | POA: Diagnosis not present

## 2020-06-29 MED FILL — ONDANSETRON ODT 8 MG TABLET: 8 | 7 days supply | Qty: 20 | Fill #0

## 2020-07-12 ENCOUNTER — Other Ambulatory Visit (HOSPITAL_COMMUNITY): Payer: Self-pay | Admitting: Psychiatry

## 2020-07-12 MED FILL — ALPRAZolam 0.5 MG TABS: 0.5 | 20 days supply | Qty: 60 | Fill #0

## 2020-07-20 MED FILL — QUETIAPINE FUMARATE 50 MG T: 50 | 30 days supply | Qty: 45 | Fill #5

## 2020-07-20 MED FILL — lamoTRIgine 100 MG TABS: 100 | 90 days supply | Qty: 270 | Fill #1

## 2020-07-20 MED FILL — VENLAFAXINE HCL ER 75 MG CA: 75 | 90 days supply | Qty: 90 | Fill #2

## 2020-07-26 ENCOUNTER — Other Ambulatory Visit: Payer: Self-pay

## 2020-07-26 DIAGNOSIS — Z20822 Contact with and (suspected) exposure to covid-19: Secondary | ICD-10-CM

## 2020-07-26 NOTE — Addendum Note (Signed)
Addended by: Kandra Nicolas on: 07/26/2020 06:03 PM   Modules accepted: Orders

## 2020-07-26 NOTE — Addendum Note (Signed)
Addended by: Kandra Nicolas on: 07/26/2020 05:47 PM   Modules accepted: Orders

## 2020-07-27 LAB — NOVEL CORONAVIRUS, NAA: SARS-CoV-2, NAA: NOT DETECTED

## 2020-07-27 LAB — SARS-COV-2, NAA 2 DAY TAT

## 2020-08-16 MED FILL — DROSPIRENONE-EE 3-0.02 MG T: 3-0.02 | 84 days supply | Qty: 84 | Fill #3

## 2020-08-16 MED FILL — QUETIAPINE FUMARATE 50 MG T: 50 | 30 days supply | Qty: 45 | Fill #6

## 2020-09-01 MED FILL — ALPRAZolam 0.5 MG TABS: 0.5 | 20 days supply | Qty: 60 | Fill #1

## 2020-09-15 MED FILL — QUETIAPINE FUMARATE 50 MG T: 50 | 30 days supply | Qty: 45 | Fill #7

## 2020-10-13 MED FILL — QUETIAPINE FUMARATE 50 MG T: 50 | 30 days supply | Qty: 45 | Fill #8

## 2020-10-13 MED FILL — ALPRAZolam 0.5 MG TABS: 0.5 | 20 days supply | Qty: 60 | Fill #2

## 2020-10-13 MED FILL — lamoTRIgine 100 MG TABS: 100 | 90 days supply | Qty: 270 | Fill #2

## 2020-10-13 MED FILL — VENLAFAXINE HCL ER 75 MG CA: 75 | 90 days supply | Qty: 90 | Fill #3

## 2020-10-30 DIAGNOSIS — Z1152 Encounter for screening for COVID-19: Secondary | ICD-10-CM | POA: Diagnosis not present

## 2020-11-03 MED FILL — DROSPIRENONE-EE 3-0.02 MG T: 3-0.02 | 84 days supply | Qty: 84 | Fill #4

## 2020-11-10 MED FILL — QUETIAPINE FUMARATE 50 MG T: 50 | 30 days supply | Qty: 45 | Fill #9

## 2020-11-26 ENCOUNTER — Other Ambulatory Visit (HOSPITAL_COMMUNITY): Payer: Self-pay | Admitting: Internal Medicine

## 2020-11-26 DIAGNOSIS — M545 Low back pain, unspecified: Secondary | ICD-10-CM | POA: Diagnosis not present

## 2020-11-26 DIAGNOSIS — Z20822 Contact with and (suspected) exposure to covid-19: Secondary | ICD-10-CM | POA: Diagnosis not present

## 2020-11-26 MED FILL — NAPROXEN 500 MG TABLET: 500 | 10 days supply | Qty: 20 | Fill #0

## 2020-11-26 MED FILL — ALPRAZolam 0.5 MG TABS: 0.5 | 20 days supply | Qty: 60 | Fill #3

## 2020-12-07 MED FILL — QUETIAPINE FUMARATE 50 MG T: 50 | 30 days supply | Qty: 45 | Fill #10

## 2020-12-28 MED FILL — QUETIAPINE FUMARATE 50 MG T: 50 | 30 days supply | Qty: 45 | Fill #11

## 2021-01-10 MED FILL — lamoTRIgine 100 MG TABS: 100 | 90 days supply | Qty: 270 | Fill #3

## 2021-01-10 MED FILL — VENLAFAXINE HCL ER 75 MG CA: 75 | 90 days supply | Qty: 90 | Fill #4 | Status: TO

## 2021-01-10 MED FILL — ALPRAZolam 0.5 MG TABS: 0.5 | 20 days supply | Qty: 60 | Fill #0 | Status: TO

## 2021-02-24 ENCOUNTER — Encounter (INDEPENDENT_AMBULATORY_CARE_PROVIDER_SITE_OTHER): Payer: Self-pay

## 2021-04-12 ENCOUNTER — Other Ambulatory Visit (HOSPITAL_COMMUNITY): Payer: Self-pay

## 2021-04-12 ENCOUNTER — Telehealth (INDEPENDENT_AMBULATORY_CARE_PROVIDER_SITE_OTHER): Payer: Self-pay | Admitting: Pediatrics

## 2021-04-12 DIAGNOSIS — G40209 Localization-related (focal) (partial) symptomatic epilepsy and epileptic syndromes with complex partial seizures, not intractable, without status epilepticus: Secondary | ICD-10-CM

## 2021-04-12 MED ORDER — LAMOTRIGINE 100 MG PO TABS
ORAL_TABLET | ORAL | 0 refills | Status: DC
  Filled 2021-04-12: qty 90, 30d supply, fill #0

## 2021-04-12 NOTE — Telephone Encounter (Signed)
Who's calling (name and relationship to patient) : Pearlean Brownie mom   Best contact number: [phone]  Provider they see: Dr. Sharene Skeans  Reason for call: Patient will call back to make appt  Call ID:      PRESCRIPTION REFILL ONLY  Name of prescription: Lamictal  Pharmacy: [facility] [place] on [address]

## 2021-04-12 NOTE — Telephone Encounter (Signed)
Patient was given a 30-day refill

## 2021-04-13 NOTE — Telephone Encounter (Signed)
Requested transfer care to [facility], female provider.  I will be happy to see her this summer.

## 2021-04-20 ENCOUNTER — Other Ambulatory Visit (HOSPITAL_COMMUNITY): Payer: Self-pay

## 2021-04-29 ENCOUNTER — Telehealth (INDEPENDENT_AMBULATORY_CARE_PROVIDER_SITE_OTHER): Payer: Self-pay | Admitting: Pediatrics

## 2021-04-29 ENCOUNTER — Ambulatory Visit (INDEPENDENT_AMBULATORY_CARE_PROVIDER_SITE_OTHER): Payer: Self-pay | Admitting: Pediatrics

## 2021-04-29 MED ORDER — LAMOTRIGINE 100 MG PO TABS: ORAL_TABLET | ORAL | 0 refills | Status: DC

## 2021-04-29 NOTE — Telephone Encounter (Signed)
Susan [name] is taking care of this for a 1 month supply of lamotrigine.  Hopefully she can make an appointment between now and then.

## 2021-04-29 NOTE — Telephone Encounter (Signed)
  Who's calling (name and relationship to patient) :Juliette Alcide ( mom)  Best contact number: [phone]  Provider they see: Dr. Marlana Salvage  Reason for call: Patient was really badly sunburnt and was up all night sick unable to make appt. Patient will call back to R/S for next week . Mom said she is completely out of her Lamictal and needs enough to make it to her appointment next week. Mom also said they are no longer going to the [facility] outpatient [facility] but it would not let me change it .      PRESCRIPTION REFILL ONLY  Name of prescription: Lamictal   Pharmacy: [facility]  [address] [place]

## 2021-04-29 NOTE — Telephone Encounter (Signed)
Patient was given a 30-day supply. She has an appt with [facility] on 07/21/2021

## 2021-05-01 ENCOUNTER — Telehealth: Payer: Self-pay

## 2021-05-02 ENCOUNTER — Encounter (INDEPENDENT_AMBULATORY_CARE_PROVIDER_SITE_OTHER): Payer: Self-pay | Admitting: Pediatrics

## 2021-05-02 ENCOUNTER — Telehealth (INDEPENDENT_AMBULATORY_CARE_PROVIDER_SITE_OTHER): Payer: BC Managed Care – PPO | Admitting: Pediatrics

## 2021-05-02 DIAGNOSIS — G40209 Localization-related (focal) (partial) symptomatic epilepsy and epileptic syndromes with complex partial seizures, not intractable, without status epilepticus: Secondary | ICD-10-CM

## 2021-05-02 MED ORDER — LAMOTRIGINE 100 MG PO TABS: ORAL_TABLET | ORAL | 1 refills | Status: DC

## 2021-05-02 NOTE — Patient Instructions (Signed)
At [facility], we are committed to providing exceptional care. You will receive a patient satisfaction survey through text or email regarding your visit today. Your opinion is important to me. Comments are appreciated.   It was a pleasure to see you today.  I am glad that you are doing well as regards your seizures.  I am sorry that there have been so many problems associated with your family life.  I hope that they will get sorted out.  I refilled your prescription that we will go through early October which should cover the time when you see Dr. Vickey Huger.  Good luck in your school.  That there anything that you need between now and when you see Dr. Vickey Huger September 29, do not hesitate to contact me because I am still your physician.  I have enjoyed caring for you.

## 2021-05-02 NOTE — Progress Notes (Signed)
This is a [facility] E-Visit follow up consult provided via [place] today Susan [name] is an adult).  Location of patient: Susan [name] is at home. Location of provider: Sherron Flemings is at [name] Patient was referred by  Susan Benders, MD  The following participants were involved in this E-Visit: Susan [name], Susan [name], Susan [name], Susan [name] (list of participants and their roles)  This visit was done via [place] Complaint/ Reason for E-Visit today: Focal epilepsy with impairment of consciousness Total time on call: 20 minutes Follow up: With Susan [name], [facility] July 21, 2021  Patient: Susan [name] MRN: [id number] Sex: adult DOB: [birth year]  Provider: Wyline Copas, MD Location of Care: [facility]  Note type: Routine return visit  History of Present Illness: Referral Source: Susan Benders, MD History from: mother, patient, and [facility] chart Chief Complaint: Focal epilepsy  Susan [name] is a 20 y.o. adult who was evaluated May 02, 2021 for the first time since Mar 10, 2020.  She has focal epilepsy with impairment of consciousness.  Her last known seizure was January, 2019.  She takes and tolerates lamotrigine without side effects.  She cannot come off of antiepileptic medication and maintain her drivers status of a full license which she needs.  She completed her freshman year at [employer] in [place].  She is studying Teacher, English as a foreign language.  Academically she did well.  But there were a number of family issues occluding having to move precipitously out of their home they are now staying in an extended stay hotel in [place].  She is on her parents insurance.  Her general health is good.  She is sleeping well.  She is looking forward to beginning her sophomore year.  I have made a referral to [facility] and  she will be seen by Dr. Larey [name] on July 21, 2021.  I needed to see her because its been over a year since I seen her and will make certain that she has enough antiepileptic medication to bridge her appointment with Dr. Brett [name].  She had a severe sunburn that she is suffered last week that made it impossible for her to come to the office.  She is recovering.  There are no other complaints today.  Review of Systems: A complete review of systems was remarkable for patient is here to be seen for focal epilepsy. She reports no concerns at this time., all other systems reviewed and negative.  Past Medical History Diagnosis Date   Anxiety    Mood disorder (Susan [name])    OCD (obsessive compulsive disorder)    Vision abnormalities    Hospitalizations: No., Head Injury: No., Nervous System Infections: No., Immunizations up to date: Yes.    Copied from prior chart notes She was seen in the [name] emergency department December 19, 2019 with a history of mood disorder, OCD, and anxiety.  She has been evaluated behavioral health for suicidal ideation, auditory and visual hallucinations.  She met inpatient criteria for treatment.  She has a history of self-mutilation and cutting, and thoughts of self-harm.  By history she had visual and auditory hallucinations of her dog who had died in 2019-06-28.  Her medical evaluation was negative except for the psychiatric examination.  Drug screen positive for THC.  She was placed on observation status at [name] until she could be transferred to a facility in [facility] called [name].  Since that time she has seen Dr. Launa [name] every other week, and also also seen other counselors at behavioral health.  She has participated in cognitive behavioral therapy and takes Seroquel and Effexor.  She has a problem with obesity.  She has a 28 pound weight gain since my last recorded weight February 18, 2018 with no change in height.  She tells me that she  has joined Susan [name] and exercises 4 days a week using the rowing machine.  This was a virtual visit so I do not know what her current weight is.  EEG September 03, 2017 which showed a normal background with evidence of sharply contoured slow-wave activity in the left mid-temporal derivations.  Findings are subtle, but I think were frequent enough that they are reliable.  This suggests the presence of a left mid-temporal seizure focus, although surface recordings can be misleading.  Interictal activity is not necessarily associated with clinical seizure behavior.   MRI scan of the brain was performed on September 15, 2017, and was normal.   Birth History 8 lbs. 6 oz. infant born at [redacted] weeks gestational age to a 20 year old g 2 p 0 1 0 1 female. Gestation was uncomplicated Mother received Epidural anesthesia  repeat cesarean section Nursery Course was uncomplicated Growth and Development was recalled as  normal   Behavior History She is followed by a psychiatrist for anxiety, depression, and problems with insomnia. She has occasional episodes of numbness and tingling of her hands, arms, and [name] that happen during panic attacks.  She also tells me that she has dissociative episodes which probably are brief fugue states.  There have been none recently.  Surgical History History reviewed. No pertinent surgical history.  Family History family history includes Arthritis in Susan [name] maternal grandmother; Bipolar disorder in Susan [name] paternal grandfather; Depression in Susan [name] paternal grandfather; Heart disease in Susan [name] maternal grandfather and maternal grandmother; Hypertension in Susan [name] maternal uncle; Stroke in Susan [name] maternal grandfather. Family history is negative for migraines, seizures, intellectual disabilities, blindness, deafness, birth defects, chromosomal disorder, or autism.  Social  History Socioeconomic History   Marital status: Single   Years of education: 14   Highest education level: Completed freshman year in college  Occupational History   Not employed  Tobacco Use   Smoking status: Never   Smokeless tobacco: Never  Substance and Sexual Activity   Alcohol use: No   Drug use: No   Sexual activity: Never  Social History Narrative   Forensic psychologist    Lives with mother,father in an extended hotel   2 puppies   Softball, piano.   Allergies Allergen Reactions   Kiwi Extract Other (See Comments)    Throat burns and swells a bit   Latex     Mother said that her child had a reaction to latex, and only uses latex free band-aids   Penicillins     Mother said child had a reaction to penicillin as infant. Has not been tested for allergy yet.   Physical Exam There were no vitals taken for this visit.  General: alert, well developed, well nourished, in no acute distress, red hair, blue eyes, left handed Head: normocephalic, no dysmorphic features Neck: supple, full range of motion Musculoskeletal: no skeletal deformities or apparent scoliosis Skin: no neurocutaneous lesions; sunburn on legs  Neurologic Exam  Mental Status: alert; oriented to person, place and year; knowledge is normal for age;  language is normal Cranial Nerves: visual fields are full to double simultaneous stimuli; extraocular movements are full and conjugate; symmetric facial strength; midline tongue and uvula; hearing appears normal bilaterally Motor: normal functional strength, tone and mass; good fine motor movements; no pronator drift Coordination: good finger-to-nose, rapid repetitive alternating movements and finger apposition Gait and Station: normal gait and station; balance is adequate; Romberg exam is negative; Gower response is negative  Assessment 1.  Focal epilepsy with impairment of consciousness, G40.209.  Discussion Ellanore is medically and neurologically stable.  I  think that she has also stabilized her depression.  Plan I refilled her lamotrigine for another month beyond what I had done.  This should cover her until she is seen by Dr. Brett [name] in late September.  I told Haniyah that if she had any needs or concerns that I would continue to provide care to her until transfer care.  Greater than 50% of a 20-minute visit was spent counseling and coordination of care and also discussing transition of care.   Medication List    Accurate as of May 02, 2021 10:10 AM. If you have any questions, ask your nurse or doctor.     TAKE these medications    ALPRAZolam 0.5 MG tablet Commonly known as: XANAX Take 0.5 mg by mouth 3 (three) times daily as needed.   drospirenone-ethinyl estradiol 3-0.02 MG tablet Commonly known as: YAZ Take 1 tablet by mouth daily.   lamoTRIgine 100 MG tablet Commonly known as: LAMICTAL Take 1 tablet by mouth in the morning and 2 tablets at nighttime .   naproxen 500 MG tablet Commonly known as: NAPROSYN TAKE 1 TABLET TWICE A DAY BY MOUTH AS NEEDED FOR 10 DAYS.   promethazine 25 MG tablet Commonly known as: PHENERGAN Take by mouth.   QUEtiapine 25 MG tablet Commonly known as: SEROQUEL Take 1 tablet (25 mg total) by mouth at bedtime. What changed: how much to take   venlafaxine XR 75 MG 24 hr capsule Commonly known as: EFFEXOR-XR Take 75 mg by mouth daily.     The medication list was reviewed and reconciled. All changes or newly prescribed medications were explained.  A complete medication list was provided to the patient/caregiver.  Jodi Geralds MD

## 2021-07-21 ENCOUNTER — Ambulatory Visit: Payer: Self-pay | Admitting: Neurology

## 2021-08-03 ENCOUNTER — Other Ambulatory Visit (INDEPENDENT_AMBULATORY_CARE_PROVIDER_SITE_OTHER): Payer: Self-pay | Admitting: Family

## 2021-08-03 DIAGNOSIS — G40209 Localization-related (focal) (partial) symptomatic epilepsy and epileptic syndromes with complex partial seizures, not intractable, without status epilepticus: Secondary | ICD-10-CM

## 2021-08-03 MED ORDER — LAMOTRIGINE 100 MG PO TABS: ORAL_TABLET | ORAL | 0 refills | Status: DC

## 2021-08-11 ENCOUNTER — Ambulatory Visit: Payer: Self-pay | Admitting: Neurology

## 2021-09-04 ENCOUNTER — Other Ambulatory Visit (INDEPENDENT_AMBULATORY_CARE_PROVIDER_SITE_OTHER): Payer: Self-pay | Admitting: Family

## 2021-09-04 DIAGNOSIS — G40209 Localization-related (focal) (partial) symptomatic epilepsy and epileptic syndromes with complex partial seizures, not intractable, without status epilepticus: Secondary | ICD-10-CM

## 2021-09-06 NOTE — Telephone Encounter (Signed)
I left a message for Susan [name] asking her to call back. She needs an appointment in order for refills to continue. I attempted to call the pharmacy to verify current phone number and address but did not get an answer despite 30 minutes of being on hold. I will write a letter to the current address asking Karene to contact the office. TG

## 2021-09-07 ENCOUNTER — Encounter (INDEPENDENT_AMBULATORY_CARE_PROVIDER_SITE_OTHER): Payer: Self-pay | Admitting: Family

## 2021-10-06 ENCOUNTER — Other Ambulatory Visit (INDEPENDENT_AMBULATORY_CARE_PROVIDER_SITE_OTHER): Payer: Self-pay | Admitting: Family

## 2021-10-06 DIAGNOSIS — G40209 Localization-related (focal) (partial) symptomatic epilepsy and epileptic syndromes with complex partial seizures, not intractable, without status epilepticus: Secondary | ICD-10-CM

## 2022-01-24 ENCOUNTER — Other Ambulatory Visit (INDEPENDENT_AMBULATORY_CARE_PROVIDER_SITE_OTHER): Payer: Self-pay | Admitting: Family

## 2022-01-24 DIAGNOSIS — G40209 Localization-related (focal) (partial) symptomatic epilepsy and epileptic syndromes with complex partial seizures, not intractable, without status epilepticus: Secondary | ICD-10-CM

## 2023-05-03 NOTE — Psych (Signed)
OT note accidentally opened instead of PHP.
